# Patient Record
Sex: Female | Born: 1980 | Race: White | Hispanic: No | Marital: Married | Smoking: Never smoker
Health system: Southern US, Community
[De-identification: ages and names within clinical notes are randomized; demographics above are authoritative.]

## PROBLEM LIST (undated history)

## (undated) DIAGNOSIS — K900 Celiac disease: Secondary | ICD-10-CM

## (undated) DIAGNOSIS — Z789 Other specified health status: Secondary | ICD-10-CM

## (undated) DIAGNOSIS — E079 Disorder of thyroid, unspecified: Secondary | ICD-10-CM

## (undated) HISTORY — DX: Disorder of thyroid, unspecified: E07.9

## (undated) HISTORY — PX: LAPAROSCOPY: SHX197

---

## 2006-06-22 ENCOUNTER — Ambulatory Visit: Payer: Self-pay | Admitting: Gastroenterology

## 2006-07-01 ENCOUNTER — Ambulatory Visit: Payer: Self-pay | Admitting: Gastroenterology

## 2006-07-31 ENCOUNTER — Ambulatory Visit: Payer: Self-pay | Admitting: Gastroenterology

## 2007-09-20 ENCOUNTER — Inpatient Hospital Stay: Payer: Self-pay | Admitting: Gastroenterology

## 2007-09-20 ENCOUNTER — Ambulatory Visit: Payer: Self-pay | Admitting: Gastroenterology

## 2007-10-27 ENCOUNTER — Ambulatory Visit: Payer: Self-pay | Admitting: Internal Medicine

## 2009-04-10 ENCOUNTER — Ambulatory Visit: Payer: Self-pay | Admitting: Gastroenterology

## 2009-07-20 ENCOUNTER — Ambulatory Visit: Payer: Self-pay | Admitting: Gastroenterology

## 2009-07-31 ENCOUNTER — Ambulatory Visit: Payer: Self-pay | Admitting: Gastroenterology

## 2009-08-07 ENCOUNTER — Ambulatory Visit: Payer: Self-pay | Admitting: Gastroenterology

## 2009-08-24 ENCOUNTER — Ambulatory Visit (HOSPITAL_COMMUNITY): Admission: RE | Admit: 2009-08-24 | Discharge: 2009-08-24 | Payer: Self-pay | Admitting: Obstetrics

## 2009-09-04 ENCOUNTER — Ambulatory Visit: Payer: Self-pay | Admitting: Gastroenterology

## 2009-10-05 ENCOUNTER — Ambulatory Visit: Payer: Self-pay | Admitting: Gastroenterology

## 2010-06-26 ENCOUNTER — Ambulatory Visit: Payer: Self-pay | Admitting: Obstetrics

## 2010-09-08 ENCOUNTER — Emergency Department: Payer: Self-pay | Admitting: Emergency Medicine

## 2010-09-26 LAB — CBC
MCV: 91.7 fL (ref 78.0–100.0)
Platelets: 264 10*3/uL (ref 150–400)
RBC: 4.32 MIL/uL (ref 3.87–5.11)
WBC: 7.3 10*3/uL (ref 4.0–10.5)

## 2010-09-26 LAB — ABO/RH: ABO/RH(D): O POS

## 2010-09-26 LAB — BASIC METABOLIC PANEL
BUN: 7 mg/dL (ref 6–23)
Calcium: 9.3 mg/dL (ref 8.4–10.5)
Chloride: 103 mEq/L (ref 96–112)
Creatinine, Ser: 0.71 mg/dL (ref 0.4–1.2)
GFR calc Af Amer: >60 mL/min (ref >60)
GFR calc non Af Amer: >60 mL/min (ref >60)

## 2010-09-26 LAB — URINALYSIS, ROUTINE W REFLEX MICROSCOPIC
Bilirubin Urine: NEGATIVE
Ketones, ur: >80 mg/dL — AB
Specific Gravity, Urine: 1.01 (ref 1.005–1.030)
Urobilinogen, UA: 0.2 mg/dL (ref 0.0–1.0)
pH: 6.5 (ref 5.0–8.0)

## 2010-09-26 LAB — TYPE AND SCREEN
ABO/RH(D): O POS
Antibody Screen: NEGATIVE

## 2010-09-26 LAB — URINE MICROSCOPIC-ADD ON

## 2010-09-26 LAB — PREGNANCY, URINE: Preg Test, Ur: NEGATIVE

## 2013-07-07 NOTE — L&D Delivery Note (Signed)
Operative Delivery Note At 6:56 AM a viable and healthy female was delivered via Vaginal, Vacuum Neurosurgeon).  Presentation: vertex; Position: Occiput,, Anterior; Station: +3.  Verbal consent: obtained from patient.  Risks and benefits discussed in detail.  Risks include, but are not limited to the risks of anesthesia, bleeding, infection, damage to maternal tissues, fetal cephalhematoma.  There is also the risk of inability to effect vaginal delivery of the head, or shoulder dystocia that cannot be resolved by established maneuvers, leading to the need for emergency cesarean section. Vacuum used due to deep variable deceleration and to decrease risk of impending fourth degree laceration APGAR: 9, 9; weight 7 lb 10.6 oz (3475 g).   Placenta status: Intact, Spontaneous.   Cord: 3 vessels with the following complications: None.  Cord YJ:WLKH  Anesthesia: Epidural  Instruments: mushroom vacuum Episiotomy: None Lacerations: 3rd degree;Perineal Suture Repair: 3.0 chromic vicryl 0-Vicryl for sphincter Est. Blood Loss (mL): 350  Mom to postpartum.  Baby to Couplet care / Skin to Skin.  Hajira Verhagen A 05/11/2014, 1:47 AM

## 2013-10-04 LAB — OB RESULTS CONSOLE RPR: RPR: NONREACTIVE

## 2013-10-04 LAB — OB RESULTS CONSOLE ANTIBODY SCREEN: ANTIBODY SCREEN: NEGATIVE

## 2013-10-04 LAB — OB RESULTS CONSOLE RUBELLA ANTIBODY, IGM: RUBELLA: NON-IMMUNE/NOT IMMUNE

## 2013-10-04 LAB — OB RESULTS CONSOLE ABO/RH: RH TYPE: POSITIVE

## 2013-10-04 LAB — OB RESULTS CONSOLE HEPATITIS B SURFACE ANTIGEN: Hepatitis B Surface Ag: NEGATIVE

## 2013-10-04 LAB — OB RESULTS CONSOLE HIV ANTIBODY (ROUTINE TESTING): HIV: NONREACTIVE

## 2013-10-21 LAB — OB RESULTS CONSOLE GC/CHLAMYDIA
Chlamydia: NEGATIVE
Gonorrhea: NEGATIVE

## 2014-03-07 ENCOUNTER — Inpatient Hospital Stay (HOSPITAL_COMMUNITY): Admission: AD | Admit: 2014-03-07 | Payer: Self-pay | Source: Ambulatory Visit | Admitting: Obstetrics

## 2014-04-18 LAB — OB RESULTS CONSOLE GBS: STREP GROUP B AG: NEGATIVE

## 2014-05-08 ENCOUNTER — Inpatient Hospital Stay (HOSPITAL_COMMUNITY)
Admission: AD | Admit: 2014-05-08 | Discharge: 2014-05-11 | DRG: 988 | Disposition: A | Discharge status: Home / Self Care | Payer: BC Managed Care – PPO | Source: Ambulatory Visit | Attending: Obstetrics and Gynecology | Admitting: Obstetrics and Gynecology

## 2014-05-08 ENCOUNTER — Encounter (HOSPITAL_COMMUNITY): Payer: Self-pay | Admitting: *Deleted

## 2014-05-08 DIAGNOSIS — O09523 Supervision of elderly multigravida, third trimester: Secondary | ICD-10-CM

## 2014-05-08 DIAGNOSIS — O9902 Anemia complicating childbirth: Secondary | ICD-10-CM | POA: Diagnosis present

## 2014-05-08 DIAGNOSIS — IMO0001 Reserved for inherently not codable concepts without codable children: Secondary | ICD-10-CM

## 2014-05-08 DIAGNOSIS — Z3A39 39 weeks gestation of pregnancy: Secondary | ICD-10-CM | POA: Diagnosis present

## 2014-05-08 DIAGNOSIS — D62 Acute posthemorrhagic anemia: Secondary | ICD-10-CM | POA: Diagnosis not present

## 2014-05-08 HISTORY — DX: Celiac disease: K90.0

## 2014-05-09 ENCOUNTER — Inpatient Hospital Stay (HOSPITAL_COMMUNITY): Payer: BC Managed Care – PPO | Admitting: Anesthesiology

## 2014-05-09 ENCOUNTER — Encounter (HOSPITAL_COMMUNITY): Payer: Self-pay | Admitting: *Deleted

## 2014-05-09 DIAGNOSIS — Z3A39 39 weeks gestation of pregnancy: Secondary | ICD-10-CM | POA: Diagnosis present

## 2014-05-09 DIAGNOSIS — Z3403 Encounter for supervision of normal first pregnancy, third trimester: Secondary | ICD-10-CM | POA: Diagnosis present

## 2014-05-09 DIAGNOSIS — O09523 Supervision of elderly multigravida, third trimester: Secondary | ICD-10-CM | POA: Diagnosis not present

## 2014-05-09 DIAGNOSIS — D62 Acute posthemorrhagic anemia: Secondary | ICD-10-CM | POA: Diagnosis not present

## 2014-05-09 DIAGNOSIS — O9902 Anemia complicating childbirth: Secondary | ICD-10-CM | POA: Diagnosis present

## 2014-05-09 DIAGNOSIS — IMO0001 Reserved for inherently not codable concepts without codable children: Secondary | ICD-10-CM

## 2014-05-09 LAB — CBC
HCT: 37.9 % (ref 36.0–46.0)
Hemoglobin: 13.3 g/dL (ref 12.0–15.0)
MCH: 30.9 pg (ref 26.0–34.0)
MCHC: 35.1 g/dL (ref 30.0–36.0)
MCV: 87.9 fL (ref 78.0–100.0)
PLATELETS: 245 10*3/uL (ref 150–400)
RBC: 4.31 MIL/uL (ref 3.87–5.11)
RDW: 14.3 % (ref 11.5–15.5)
WBC: 14.8 10*3/uL — AB (ref 4.0–10.5)

## 2014-05-09 LAB — TYPE AND SCREEN
ABO/RH(D): O POS
ANTIBODY SCREEN: NEGATIVE

## 2014-05-09 LAB — RPR

## 2014-05-09 MED ORDER — LANOLIN HYDROUS EX OINT: TOPICAL_OINTMENT | CUTANEOUS | Status: DC | PRN

## 2014-05-09 MED ORDER — FERROUS SULFATE 325 (65 FE) MG PO TABS: 325.0000 mg | ORAL_TABLET | Freq: Two times a day (BID) | ORAL | Status: DC

## 2014-05-09 MED ORDER — PHENYLEPHRINE 40 MCG/ML (10ML) SYRINGE FOR IV PUSH (FOR BLOOD PRESSURE SUPPORT)
80.0000 ug | PREFILLED_SYRINGE | INTRAVENOUS | Status: DC | PRN
  Filled 2014-05-09: qty 2

## 2014-05-09 MED ORDER — TERBUTALINE SULFATE 1 MG/ML IJ SOLN: 0.2500 mg | Freq: Once | INTRAMUSCULAR | Status: DC | PRN

## 2014-05-09 MED ORDER — DIPHENHYDRAMINE HCL 50 MG/ML IJ SOLN: 12.5000 mg | INTRAMUSCULAR | Status: DC | PRN

## 2014-05-09 MED ORDER — OXYTOCIN 40 UNITS IN LACTATED RINGERS INFUSION - SIMPLE MED
1.0000 m[IU]/min | INTRAVENOUS | Status: DC
  Administered 2014-05-09: 2 m[IU]/min via INTRAVENOUS

## 2014-05-09 MED ORDER — LACTATED RINGERS IV SOLN
500.0000 mL | Freq: Once | INTRAVENOUS | Status: AC
  Administered 2014-05-09: 500 mL via INTRAVENOUS

## 2014-05-09 MED ORDER — FENTANYL 2.5 MCG/ML BUPIVACAINE 1/10 % EPIDURAL INFUSION (WH - ANES)
INTRAMUSCULAR | Status: DC | PRN
  Administered 2014-05-09: 14 mL/h via EPIDURAL

## 2014-05-09 MED ORDER — ONDANSETRON HCL 4 MG/2ML IJ SOLN: 4.0000 mg | Freq: Four times a day (QID) | INTRAMUSCULAR | Status: DC | PRN

## 2014-05-09 MED ORDER — TERBUTALINE SULFATE 1 MG/ML IJ SOLN
0.2500 mg | Freq: Once | INTRAMUSCULAR | Status: DC | PRN
Start: 2014-05-09 — End: 2014-05-09

## 2014-05-09 MED ORDER — OXYTOCIN 40 UNITS IN LACTATED RINGERS INFUSION - SIMPLE MED
62.5000 mL/h | INTRAVENOUS | Status: DC
  Filled 2014-05-09: qty 1000

## 2014-05-09 MED ORDER — ONDANSETRON HCL 4 MG/2ML IJ SOLN: 4.0000 mg | INTRAMUSCULAR | Status: DC | PRN

## 2014-05-09 MED ORDER — CITRIC ACID-SODIUM CITRATE 334-500 MG/5ML PO SOLN: 30.0000 mL | ORAL | Status: DC | PRN

## 2014-05-09 MED ORDER — SIMETHICONE 80 MG PO CHEW: 80.0000 mg | CHEWABLE_TABLET | ORAL | Status: DC | PRN

## 2014-05-09 MED ORDER — PRENATAL MULTIVITAMIN CH
1.0000 | ORAL_TABLET | Freq: Every day | ORAL | Status: DC
  Filled 2014-05-09 (×2): qty 1

## 2014-05-09 MED ORDER — SENNOSIDES-DOCUSATE SODIUM 8.6-50 MG PO TABS
2.0000 | ORAL_TABLET | ORAL | Status: DC
  Administered 2014-05-10: 2 via ORAL
  Filled 2014-05-09: qty 2

## 2014-05-09 MED ORDER — LACTATED RINGERS IV SOLN: 500.0000 mL | INTRAVENOUS | Status: DC | PRN

## 2014-05-09 MED ORDER — INFLUENZA VAC SPLIT QUAD 0.5 ML IM SUSY: 0.5000 mL | PREFILLED_SYRINGE | INTRAMUSCULAR | Status: DC | PRN

## 2014-05-09 MED ORDER — DIPHENHYDRAMINE HCL 25 MG PO CAPS: 25.0000 mg | ORAL_CAPSULE | Freq: Four times a day (QID) | ORAL | Status: DC | PRN

## 2014-05-09 MED ORDER — FENTANYL 2.5 MCG/ML BUPIVACAINE 1/10 % EPIDURAL INFUSION (WH - ANES)
14.0000 mL/h | INTRAMUSCULAR | Status: DC | PRN
  Administered 2014-05-09: 14 mL/h via EPIDURAL
  Filled 2014-05-09: qty 125

## 2014-05-09 MED ORDER — LIDOCAINE HCL (PF) 1 % IJ SOLN
INTRAMUSCULAR | Status: DC | PRN
  Administered 2014-05-09 (×2): 8 mL

## 2014-05-09 MED ORDER — EPHEDRINE 5 MG/ML INJ
10.0000 mg | INTRAVENOUS | Status: DC | PRN
  Filled 2014-05-09: qty 2

## 2014-05-09 MED ORDER — DIBUCAINE 1 % RE OINT: 1.0000 "application " | TOPICAL_OINTMENT | RECTAL | Status: DC | PRN

## 2014-05-09 MED ORDER — OXYCODONE-ACETAMINOPHEN 5-325 MG PO TABS: 2.0000 | ORAL_TABLET | ORAL | Status: DC | PRN

## 2014-05-09 MED ORDER — BENZOCAINE-MENTHOL 20-0.5 % EX AERO
1.0000 "application " | INHALATION_SPRAY | CUTANEOUS | Status: DC | PRN
  Administered 2014-05-09: 1 via TOPICAL
  Filled 2014-05-09 (×2): qty 56

## 2014-05-09 MED ORDER — WITCH HAZEL-GLYCERIN EX PADS: 1.0000 "application " | MEDICATED_PAD | CUTANEOUS | Status: DC | PRN

## 2014-05-09 MED ORDER — LIDOCAINE HCL (PF) 1 % IJ SOLN
30.0000 mL | INTRAMUSCULAR | Status: DC | PRN
  Filled 2014-05-09: qty 30

## 2014-05-09 MED ORDER — LACTATED RINGERS IV SOLN
INTRAVENOUS | Status: DC
  Administered 2014-05-09 (×2): via INTRAVENOUS

## 2014-05-09 MED ORDER — OXYCODONE-ACETAMINOPHEN 5-325 MG PO TABS
1.0000 | ORAL_TABLET | ORAL | Status: DC | PRN
  Administered 2014-05-09 – 2014-05-11 (×5): 1 via ORAL
  Filled 2014-05-09 (×4): qty 1

## 2014-05-09 MED ORDER — OXYCODONE-ACETAMINOPHEN 5-325 MG PO TABS
2.0000 | ORAL_TABLET | ORAL | Status: DC | PRN
  Administered 2014-05-10: 2 via ORAL
  Filled 2014-05-09: qty 2

## 2014-05-09 MED ORDER — OXYCODONE-ACETAMINOPHEN 5-325 MG PO TABS
1.0000 | ORAL_TABLET | ORAL | Status: DC | PRN
  Filled 2014-05-09: qty 1

## 2014-05-09 MED ORDER — FLEET ENEMA 7-19 GM/118ML RE ENEM: 1.0000 | ENEMA | RECTAL | Status: DC | PRN

## 2014-05-09 MED ORDER — ZOLPIDEM TARTRATE 5 MG PO TABS: 5.0000 mg | ORAL_TABLET | Freq: Every evening | ORAL | Status: DC | PRN

## 2014-05-09 MED ORDER — IBUPROFEN 600 MG PO TABS
600.0000 mg | ORAL_TABLET | Freq: Four times a day (QID) | ORAL | Status: DC
  Administered 2014-05-09 – 2014-05-11 (×10): 600 mg via ORAL
  Filled 2014-05-09 (×12): qty 1

## 2014-05-09 MED ORDER — MISOPROSTOL 200 MCG PO TABS
ORAL_TABLET | ORAL | Status: AC
  Filled 2014-05-09: qty 4

## 2014-05-09 MED ORDER — OXYTOCIN BOLUS FROM INFUSION: 500.0000 mL | INTRAVENOUS | Status: DC

## 2014-05-09 MED ORDER — ONDANSETRON HCL 4 MG PO TABS: 4.0000 mg | ORAL_TABLET | ORAL | Status: DC | PRN

## 2014-05-09 MED ORDER — ACETAMINOPHEN 325 MG PO TABS: 650.0000 mg | ORAL_TABLET | ORAL | Status: DC | PRN

## 2014-05-09 MED ORDER — PHENYLEPHRINE 40 MCG/ML (10ML) SYRINGE FOR IV PUSH (FOR BLOOD PRESSURE SUPPORT)
80.0000 ug | PREFILLED_SYRINGE | INTRAVENOUS | Status: DC | PRN
  Filled 2014-05-09: qty 10
  Filled 2014-05-09: qty 2

## 2014-05-09 NOTE — Plan of Care (Signed)
Problem: Phase I Progression Outcomes Goal: Voiding adequately Outcome: Completed/Met Date Met:  05/09/14

## 2014-05-09 NOTE — Anesthesia Preprocedure Evaluation (Signed)

## 2014-05-09 NOTE — Lactation Note (Signed)
This note was copied from the chart of [facility]. Lactation Consultation Note Initial visit at 35 hours of age.  Mom reports baby is feeding for the 3rd time.  She reports this being the first time and a little soreness on this left side.  Mom is holding baby in a well supported football hold, with wide flanged lips and rhythmic sucking.  Baby stops after about 20 minutes and then mom needs minimal assist to latch baby back to breast.  Nipple only slightly compressed, but mostly round when baby unlatched.   Mom praised for holding baby deeply at breast with nose chin and cheeks touching breast.  Baby total feeding of 35 minutes.  [facility] resources given and discussed.  Encouraged to feed with early cues on demand.  Early newborn behavior discussed.  Hand expression demonstrated with colostrum visible.  Mom to call for assist as needed.    Patient Name: Brittney [name] Today's Date: 05/09/2014 Reason for consult: Initial assessment   Maternal Data Has patient been taught Hand Expression?: Yes Does the patient have breastfeeding experience prior to this delivery?: No  Feeding Feeding Type: Breast Fed Length of feed: 35 min (good feeding)  LATCH Score/Interventions Latch: Grasps breast easily, tongue down, lips flanged, rhythmical sucking.  Audible Swallowing: A few with stimulation Intervention(s): Skin to skin;Hand expression;Alternate breast massage  Type of Nipple: Everted at rest and after stimulation  Comfort (Breast/Nipple): Soft / non-tender     Hold (Positioning): Assistance needed to correctly position infant at breast and maintain latch. Intervention(s): Skin to skin;Position options;Support Pillows;Breastfeeding basics reviewed  LATCH Score: 8  Lactation Tools Discussed/Used     Consult Status Consult Status: Follow-up Date: 05/10/14 Follow-up type: In-patient    Kendell Bane Justine Null 05/09/2014, 5:17 PM

## 2014-05-09 NOTE — Anesthesia Procedure Notes (Signed)
Epidural Patient location during procedure: OB Start time: 05/09/2014 3:08 AM End time: 05/09/2014 3:12 AM  Staffing Anesthesiologist: Lyn Hollingshead  Preanesthetic Checklist Completed: patient identified, surgical consent, pre-op evaluation, timeout performed, IV checked, risks and benefits discussed and monitors and equipment checked  Epidural Patient position: sitting Prep: site prepped and draped and DuraPrep Patient monitoring: continuous pulse ox and blood pressure Approach: midline Location: L3-L4 Injection technique: LOR air  Needle:  Needle type: Tuohy  Needle gauge: 17 G Needle length: 9 cm and 9 Needle insertion depth: 6 cm Catheter type: closed end flexible Catheter size: 19 Gauge Catheter at skin depth: 11 cm Test dose: negative and Other  Assessment Sensory level: T9 Events: blood not aspirated, injection not painful, no injection resistance, negative IV test and paresthesia  Additional Notes L legReason for block:procedure for pain

## 2014-05-09 NOTE — Plan of Care (Signed)
Problem: Phase I Progression Outcomes Goal: Initial discharge plan identified Outcome: Completed/Met Date Met:  05/09/14     

## 2014-05-09 NOTE — H&P (Signed)
Brittney Brittney [name] is Brittney [name] 33 y.o. female presenting @ term in active labor` Maternal Medical History:  Reason for admission: Contractions.   Fetal activity: Perceived fetal activity is normal.    Prenatal complications: no prenatal complications   OB History    Gravida Para Term Preterm AB TAB SAB Ectopic Multiple Living   1              Past Medical History  Diagnosis Date  . Celiac disease    Past Surgical History  Procedure Laterality Date  . Laparoscopy     Family History: family history is not on file. Social History:  reports that she has never smoked. She has never used smokeless tobacco. She reports that she does not drink alcohol or use illicit drugs.   Prenatal Transfer Tool  Maternal Diabetes: No Genetic Screening: Declined Maternal Ultrasounds/Referrals: Normal Fetal Ultrasounds or other Referrals:  None Maternal Substance Abuse:  No Significant Maternal Medications:  None Significant Maternal Lab Results:  Lab values include: Group B Strep negative Other Comments:  None  Review of Systems  All other systems reviewed and are negative.   Dilation: 10 Effacement (%): 100 Station: -1 Exam by:: Humberto Leep RN  Blood pressure 105/65, pulse 106, temperature 97.5 F (36.4 C), temperature source Oral, resp. rate 18, height 5\' 7"  (1.702 m), weight 80.922 kg (178 lb 6.4 oz). Exam Physical Exam  Constitutional: She is oriented to person, place, and time. She appears well-developed and well-nourished.  HENT:  Head: Atraumatic.  Eyes: EOM are normal.  Neck: Neck supple.  Cardiovascular: Regular rhythm.   Respiratory: Breath sounds normal.  GI: Soft.  Musculoskeletal: She exhibits no edema.  Neurological: She is alert and oriented to person, place, and time.  Skin: Skin is warm and dry.  Psychiatric: She has Brittney [name] normal mood and affect.    Prenatal labs: ABO, Rh: O/Positive/-- (03/31 0000) Antibody: Negative (03/31 0000) Rubella: Nonimmune (03/31 0000) RPR:  Nonreactive (03/31 0000)  HBsAg: Negative (03/31 0000)  HIV: Non-reactive (03/31 0000)  GBS: Negative (10/13 0000)   Assessment/Plan: Active phase Term gestation P0 admit routine labs. Epidural . Amniotomy prn. Rubella vaccine pp   Brittney Brittney [name] 05/09/2014, 4:00 AM

## 2014-05-09 NOTE — Anesthesia Postprocedure Evaluation (Signed)
Anesthesia Post Note  Patient: Brittney [name]  Procedure(s) Performed: * No procedures listed *  Anesthesia type: Epidural  Patient location: Mother/Baby  Post pain: Pain level controlled  Post assessment: Post-op Vital signs reviewed  Last Vitals:  Filed Vitals:   05/09/14 1123  BP: 133/84  Pulse: 92  Temp: 37.4 C  Resp: 20    Post vital signs: Reviewed  Level of consciousness:alert  Complications: No apparent anesthesia complications

## 2014-05-09 NOTE — MAU Note (Signed)
Pt in room. Will place monitors.

## 2014-05-09 NOTE — Progress Notes (Signed)
S: breathing with ctx  O: E$pidural VE fully -2/-1 AROM clear fluid asynclytic LOP  Tracing: baseline 140 (+) accel Ctx q 2-4 mins( couplets)  IMP: arrest of descent term gestation P) right exaggerated sims position. Pitocin augmentation

## 2014-05-10 DIAGNOSIS — D62 Acute posthemorrhagic anemia: Secondary | ICD-10-CM | POA: Diagnosis not present

## 2014-05-10 LAB — CBC
HEMATOCRIT: 29.9 % — AB (ref 36.0–46.0)
HEMOGLOBIN: 10.3 g/dL — AB (ref 12.0–15.0)
MCH: 30.8 pg (ref 26.0–34.0)
MCHC: 34.4 g/dL (ref 30.0–36.0)
MCV: 89.5 fL (ref 78.0–100.0)
Platelets: 200 10*3/uL (ref 150–400)
RBC: 3.34 MIL/uL — ABNORMAL LOW (ref 3.87–5.11)
RDW: 14.8 % (ref 11.5–15.5)
WBC: 16.3 10*3/uL — AB (ref 4.0–10.5)

## 2014-05-10 MED ORDER — DOCUSATE SODIUM 100 MG PO CAPS
100.0000 mg | ORAL_CAPSULE | Freq: Three times a day (TID) | ORAL | Status: DC
  Administered 2014-05-10 – 2014-05-11 (×2): 100 mg via ORAL
  Filled 2014-05-10 (×2): qty 1

## 2014-05-10 MED ORDER — MEASLES, MUMPS & RUBELLA VAC ~~LOC~~ INJ
0.5000 mL | INJECTION | Freq: Once | SUBCUTANEOUS | Status: AC
  Administered 2014-05-11: 0.5 mL via SUBCUTANEOUS
  Filled 2014-05-10 (×2): qty 0.5

## 2014-05-10 NOTE — Progress Notes (Signed)
PPD #1- SVD  Subjective:   Reports feeling ok, frustrated with BF Tolerating po/ No nausea or vomiting Bleeding is light Pain controlled with Motrin and Percocet Up ad lib / ambulatory / voiding without problems Newborn: breastfeeding-trouble latching    Objective:   VS:  VS:  Filed Vitals:   05/09/14 1502 05/09/14 2040 05/10/14 0031 05/10/14 0557  BP: 125/74 130/71 111/75 130/80  Pulse: 84 84 102 88  Temp: 98.4 F (36.9 C) 97.9 F (36.6 C) 98.2 F (36.8 C) 98.2 F (36.8 C)  TempSrc:  Oral Oral Oral  Resp: 20 20 20    Height:      Weight:      SpO2:  99% 97% 99%    LABS:  Recent Labs  05/09/14 0200 05/10/14 0547  WBC 14.8* 16.3*  HGB 13.3 10.3*  PLT 245 200   Blood type: --/--/O POS (11/03 0200) Rubella: Nonimmune (03/31 0000)   I&O: Intake/Output      11/03 0701 - 11/04 0700 11/04 0701 - 11/05 0700   Blood     Total Output       Net              Physical Exam: Alert and oriented x3 Abdomen: soft, non-tender, non-distended  Fundus: firm, non-tender, U-2 Perineum: Well approximated, no significant erythema, edema, or drainage; healing well. Lochia: small Extremities: No edema, no calf pain or tenderness    Assessment:  PPD #1 G1P1001/ S/P:vacuum extraction, 3rd degree laceration Mild anemia  Doing well    Plan: Continue routine post partum orders Lactation for assessment and recommendations Anticipate D/C home tomorrow   Julianne Handler, N MSN, CNM 05/10/2014, 10:20 AM

## 2014-05-10 NOTE — Lactation Note (Signed)
This note was copied from the chart of [place]. Lactation Consultation Note  Mother weepy, sore and requested assistance with latching. Mother''s nipples pink with abrasions on tips. Latched on left without NS and baby breastfed for 15 min with mother pain score 6.  Nipple pinched after. Relatched on right with #20NS and mother had improved comfort.  Baby breastfed for 9 min.  Nipple round after feeding. Provided mother with comfort gels for soreness.  Mother needs reassurance.  She demonstrates good positioning. Baby has disorganized suck.  Reviewed suck training.  Suggest parents make an outpt appt before discharge.  Patient Name: Brittney [name] [id number] Date: 05/10/2014 Reason for consult: Follow-up assessment   Maternal Data    Feeding Feeding Type: Breast Fed Length of feed: 24 min (half the time used #20NS)  LATCH Score/Interventions Latch: Grasps breast easily, tongue down, lips flanged, rhythmical sucking. Intervention(s): Adjust position;Assist with latch;Breast massage  Audible Swallowing: A few with stimulation  Type of Nipple: Everted at rest and after stimulation  Comfort (Breast/Nipple): Engorged, cracked, bleeding, large blisters, severe discomfort Problem noted: Cracked, bleeding, blisters, bruises Intervention(s): Expressed breast milk to nipple     Hold (Positioning): Assistance needed to correctly position infant at breast and maintain latch.  LATCH Score: 6  Lactation Tools Discussed/Used     Consult Status Consult Status: Follow-up Date: 05/11/14 Follow-up type: In-patient    [name] [facility] 05/10/2014, 10:11 AM

## 2014-05-10 NOTE — Plan of Care (Signed)
Problem: Discharge Progression Outcomes Goal: Tolerating diet Outcome: Completed/Met Date Met:  05/10/14     

## 2014-05-10 NOTE — Progress Notes (Signed)
RN in room and percocet given for pain scale 3 at present per pt request.Pt had asked RN earlier about gluten in senokot. RN spoke with pharmacist -"[name]'  . Pharmacist stated that no clinical information available to say no gluten in senokot or to say there is. Pt stated she can take COLACE. Husband made statement that pt had had Celiac Disease before.

## 2014-05-11 MED ORDER — IBUPROFEN 600 MG PO TABS: 600.0000 mg | ORAL_TABLET | Freq: Four times a day (QID) | ORAL | Status: DC

## 2014-05-11 NOTE — Plan of Care (Signed)
Problem: Consults Goal: Postpartum Patient Education (See Patient Education module for education specifics.)  Outcome: Not Applicable Date Met:  74/94/49 Goal: Skin Care Protocol Initiated - if Braden Score 18 or less If consults are not indicated, leave blank or document N/A  Outcome: Completed/Met Date Met:  05/11/14 Goal: Nutrition Consult-if indicated Outcome: Not Applicable Date Met:  67/59/16  Problem: Discharge Progression Outcomes Goal: Afebrile, VS remain stable at discharge Outcome: Completed/Met Date Met:  05/11/14 Goal: Other Discharge Outcomes/Goals Outcome: Completed/Met Date Met:  05/11/14

## 2014-05-11 NOTE — Discharge Instructions (Signed)
Breast Pumping Tips °If you are breastfeeding, there may be times when you cannot feed your baby directly. Returning to work or going on a trip are common examples. Pumping allows you to store breast milk and feed it to your baby later.  °You may not get much milk when you first start to pump. Your breasts should start to make more after a few days. If you pump at the times you usually feed your baby, you may be able to keep making enough milk to feed your baby without also using formula. The more often you pump, the more milk you will produce.  °WHEN SHOULD I PUMP?  °· You can begin to pump soon after delivery. However, some experts recommend waiting about 4 weeks before giving your infant a bottle to make sure breastfeeding is going well.  °· If you plan to return to work, begin pumping a few weeks before. This will help you develop techniques that work best for you. It also lets you build up a supply of breast milk.   °· When you are with your infant, feed on demand and pump after each feeding.   °· When you are away from your infant for several hours, pump for about 15 minutes every 2-3 hours. Pump both breasts at the same time if you can.   °· If your infant has a formula feeding, make sure to pump around the same time.     °· If you drink any alcohol, wait 2 hours before pumping.   °HOW DO I PREPARE TO PUMP? °Your let-down reflex is the natural reaction to stimulation that makes your breast milk flow. It is easier to stimulate this reflex when you are relaxed. Find relaxation techniques that work for you. If you have difficulty with your let-down reflex, try these methods:  °· Smell one of your infant's blankets or an item of clothing.   °· Look at a picture or video of your infant.   °· Sit in a quiet, private space.   °· Massage the breast you plan to pump.   °· Place soothing warmth on the breast.   °· Play relaxing music.   °WHAT ARE SOME GENERAL BREAST PUMPING TIPS? °· Wash your hands before you pump. You  do not need to wash your nipples or breasts. °· There are three ways to pump. °¨ You can use your hand to massage and compress your breast. °¨ You can use a handheld manual pump. °¨ You can use an electric pump.   °· Make sure the suction cup (flange) on the breast pump is the right size. Place the flange directly over the nipple. If it is the wrong size or placed the wrong way, it may be painful and cause nipple damage.   °· If pumping is uncomfortable, apply a small amount of purified or modified lanolin to your nipple and areola. °· If you are using an electric pump, adjust the speed and suction power to be more comfortable. °· If pumping is painful or if you are not getting very much milk, you may need a different type of pump. A lactation consultant can help you determine what type of pump to use.   °· Keep a full water bottle near you at all times. Drinking lots of fluid helps you make more milk.  °· You can store your milk to use later. Pumped breast milk can be stored in a sealable, sterile container or plastic bag. Label all stored breast milk with the date you pumped it. °¨ Milk can stay out at room temperature for up to 8 hours. °¨   You can store your milk in the refrigerator for up to 8 days. °¨ You can store your milk in the freezer for 3 months. Thaw frozen milk using warm water. Do not put it in the microwave. °· Do not smoke. Smoking can lower your milk supply and harm your infant. If you need help quitting, ask your health care provider to recommend a program.   °WHEN SHOULD I CALL MY HEALTH CARE PROVIDER OR A LACTATION CONSULTANT? °· You are having trouble pumping. °· You are concerned that you are not making enough milk. °· You have nipple pain, soreness, or redness. °· You want to use birth control. Birth control pills may lower your milk supply. Talk to your health care provider about your options. °Document Released: 12/11/2009 Document Revised: 06/28/2013 Document Reviewed:  04/15/2013 °Exit[employer]® Patient Information ©2015 [employer]. This information is not intended to replace advice given to you by your health care provider. Make sure you discuss any questions you have with your health care provider. ° °Nutrition for the New Mother  °A new mother needs good health and nutrition so she can have energy to take care of a new baby. Whether a mother breastfeeds or formula feeds the baby, it is important to have a well-balanced diet. Foods from all the food groups should be chosen to meet the new mother's energy needs and to give her the nutrients needed for repair and healing.  °A HEALTHY EATING PLAN °The My Pyramid plan for Moms outlines what you should eat to help you and your baby stay healthy. The energy and amount of food you need depends on whether or not you are breastfeeding. If you are breastfeeding you will need more nutrients. If you choose not to breastfeed, your nutrition goal should be to return to a healthy weight. Limiting calories may be needed if you are not breastfeeding.  °HOME CARE INSTRUCTIONS  °· For a personal plan based on your unique needs, see your Registered Dietitian or visit www.mypyramid.gov. °· Eat a variety of foods. The plan below will help guide you. The following chart has a suggested daily meal plan from the My Pyram[employer] for Moms. °· Eat a variety of fruits and vegetables. °· Eat more dark green and orange vegetables and cooked dried beans. °· Make half your grains whole grains. Choose whole instead of refined grains. °· Choose low-fat or lean meats and poultry. °· Choose low-fat or fat-free dairy products like milk, cheese, or yogurt. °Fruits °· Breastfeeding: 2 cups °· Non-Breastfeeding: 2 cups °· What Counts as a serving? °¨ 1 cup of fruit or juice. °¨ ½ cup dried fruit. °Vegetables °· Breastfeeding: 3 cups °· Non-Breastfeeding: 2 ½ cups °· What Counts as a serving? °¨ 1 cup raw or cooked vegetables. °¨ Juice or 2 cups raw leafy  vegetables. °Grains °· Breastfeeding: 8 oz °· Non-Breastfeeding: 6 oz °· What Counts as a serving? °¨ 1 slice bread. °¨ 1 oz ready-to-eat cereal. °¨ ½ cup cooked pasta, rice, or cereal. °Meat and Beans °· Breastfeeding: 6 ½ oz °· Non-Breastfeeding: 5 ½ oz °· What Counts as a serving? °¨ 1 oz lean meat, poultry, or fish °¨ ¼ cup cooked dry beans °¨ ½ oz nuts or 1 egg °¨ 1 tbs peanut butter °Milk °· Breastfeeding: 3 cups °· Non-Breastfeeding: 3 cups °· What Counts as a serving? °¨ 1 cup milk. °¨ 8 oz yogurt. °¨ 1 ½ oz cheese. °¨ 2 oz processed cheese. °TIPS FOR THE BREASTFEEDING MOM °· Rapid weight   loss is not suggested when you are breastfeeding. By simply breastfeeding, you will be able to lose the weight gained during your pregnancy. Your caregiver can keep track of your weight and tell you if your weight loss is appropriate.  Be sure to drink fluids. You may notice that you are thirstier than usual. A suggestion is to drink a glass of water or other beverage whenever you breastfeed.  Avoid alcohol as it can be passed into your breast milk.  Limit caffeine drinks to no more than 2 to 3 cups per day.  You may need to keep taking your prenatal vitamin while you are breastfeeding. Talk with your caregiver about taking a vitamin or supplement. RETURING TO A HEALTHY WEIGHT  The My Pyramid Plan for Moms will help you return to a healthy weight. It will also provide the nutrients you need.  You may need to limit "empty" calories. These include:  High fat foods like fried foods, fatty meats, fast food, butter, and mayonnaise.  High sugar foods like sodas, jelly, candy, and sweets.  Be physically active. Include 30 minutes of exercise or more each day. Choose an activity you like such as walking, swimming, biking, or aerobics. Check with your caregiver before you start to exercise. Document Released: 09/30/2007 Document Revised: 09/15/2011 Document Reviewed: 09/30/2007 [facility] Patient Information  2015 [place], [place]. This information is not intended to replace advice given to you by your health care provider. Make sure you discuss any questions you have with your health care provider. Postpartum Depression and Baby Blues The postpartum period begins right after the birth of a baby. During this time, there is often a great amount of joy and excitement. It is also a time of many changes in the life of the parents. Regardless of how many times a mother gives birth, each child brings new challenges and dynamics to the family. It is not unusual to have feelings of excitement along with confusing shifts in moods, emotions, and thoughts. All mothers are at risk of developing postpartum depression or the "baby blues." These mood changes can occur right after giving birth, or they may occur many months after giving birth. The baby blues or postpartum depression can be mild or severe. Additionally, postpartum depression can go away rather quickly, or it can be a long-term condition.  CAUSES Raised hormone levels and the rapid drop in those levels are thought to be a main cause of postpartum depression and the baby blues. A number of hormones change during and after pregnancy. Estrogen and progesterone usually decrease right after the delivery of your baby. The levels of thyroid hormone and various cortisol steroids also rapidly drop. Other factors that play a role in these mood changes include major life events and genetics.  RISK FACTORS If you have any of the following risks for the baby blues or postpartum depression, know what symptoms to watch out for during the postpartum period. Risk factors that may increase the likelihood of getting the baby blues or postpartum depression include:  Having a personal or family history of depression.   Having depression while being pregnant.   Having premenstrual mood issues or mood issues related to oral contraceptives.  Having a lot of life stress.   Having  marital conflict.   Lacking a social support network.   Having a baby with special needs.   Having health problems, such as diabetes.  SIGNS AND SYMPTOMS Symptoms of baby blues include:  Brief changes in mood, such as going  from extreme happiness to sadness. °· Decreased concentration.   °· Difficulty sleeping.   °· Crying spells, tearfulness.   °· Irritability.   °· Anxiety.   °Symptoms of postpartum depression typically begin within the first month after giving birth. These symptoms include: °· Difficulty sleeping or excessive sleepiness.   °· Marked weight loss.   °· Agitation.   °· Feelings of worthlessness.   °· Lack of interest in activity or food.   °Postpartum psychosis is a very serious condition and can be dangerous. Fortunately, it is rare. Displaying any of the following symptoms is cause for immediate medical attention. Symptoms of postpartum psychosis include:  °· Hallucinations and delusions.   °· Bizarre or disorganized behavior.   °· Confusion or disorientation.   °DIAGNOSIS  °A diagnosis is made by an evaluation of your symptoms. There are no medical or lab tests that lead to a diagnosis, but there are various questionnaires that a health care provider may use to identify those with the baby blues, postpartum depression, or psychosis. Often, a screening tool called the Edinburgh Postnatal Depression Scale is used to diagnose depression in the postpartum period.  °TREATMENT °The baby blues usually goes away on its own in 1-2 weeks. Social support is often all that is needed. You will be encouraged to get adequate sleep and rest. Occasionally, you may be given medicines to help you sleep.  °Postpartum depression requires treatment because it can last several months or longer if it is not treated. Treatment may include individual or group therapy, medicine, or both to address any social, physiological, and psychological factors that may play a role in the depression. Regular exercise, a  healthy diet, rest, and social support may also be strongly recommended.  °Postpartum psychosis is more serious and needs treatment right away. Hospitalization is often needed. °HOME CARE INSTRUCTIONS °· Get as much rest as you can. Nap when the baby sleeps.   °· Exercise regularly. Some women find yoga and walking to be beneficial.   °· Eat a balanced and nourishing diet.   °· Do little things that you enjoy. Have a cup of tea, take a bubble bath, read your favorite magazine, or listen to your favorite music. °· Avoid alcohol.   °· Ask for help with household chores, cooking, grocery shopping, or running errands as needed. Do not try to do everything.   °· Talk to people close to you about how you are feeling. Get support from your partner, family members, friends, or other new moms. °· Try to stay positive in how you think. Think about the things you are grateful for.   °· Do not spend a lot of time alone.   °· Only take over-the-counter or prescription medicine as directed by your health care provider. °· Keep all your postpartum appointments.   °· Let your health care provider know if you have any concerns.   °SEEK MEDICAL CARE IF: °You are having a reaction to or problems with your medicine. °SEEK IMMEDIATE MEDICAL CARE IF: °· You have suicidal feelings.   °· You think you may harm the baby or someone else. °MAKE SURE YOU: °· Understand these instructions. °· Will watch your condition. °· Will get help right away if you are not doing well or get worse. °Document Released: 03/27/2004 Document Revised: 06/28/2013 Document Reviewed: 04/04/2013 °Exit[employer]® Patient Information ©2015 [employer]. This information is not intended to replace advice given to you by your health care provider. Make sure you discuss any questions you have with your health care provider. °Breastfeeding and Mastitis °Mastitis is inflammation of the breast tissue. It can occur in women who   are breastfeeding. This can make breastfeeding  painful. Mastitis will sometimes go away on its own. Your health care provider will help determine if treatment is needed. °CAUSES °Mastitis is often associated with a blocked milk (lactiferous) duct. This can happen when too much milk builds up in the breast. Causes of excess milk in the breast can include: °· Poor latch-on. If your baby is not latched onto the breast properly, she or he may not empty your breast completely while breastfeeding. °· Allowing too much time to pass between feedings. °· Wearing a bra or other clothing that is too tight. This puts extra pressure on the lactiferous ducts so milk does not flow through them as it should. °Mastitis can also be caused by a bacterial infection. Bacteria may enter the breast tissue through cuts or openings in the skin. In women who are breastfeeding, this may occur because of cracked or irritated skin. Cracks in the skin are often caused when your baby does not latch on properly to the breast. °SIGNS AND SYMPTOMS °· Swelling, redness, tenderness, and pain in an area of the breast. °· Swelling of the glands under the arm on the same side. °· Fever may or may not accompany mastitis. °If an infection is allowed to progress, a collection of pus (abscess) may develop. °DIAGNOSIS  °Your health care provider can usually diagnose mastitis based on your symptoms and a physical exam. Tests may be done to help confirm the diagnosis. These may include: °· Removal of pus from the breast by applying pressure to the area. This pus can be examined in the lab to determine which bacteria are present. If an abscess has developed, the fluid in the abscess can be removed with a needle. This can also be used to confirm the diagnosis and determine the bacteria present. In most cases, pus will not be present. °· Blood tests to determine if your body is fighting a bacterial infection. °· Mammogram or ultrasound tests to rule out other problems or diseases. °TREATMENT  °Mastitis that  occurs with breastfeeding will sometimes go away on its own. Your health care provider may choose to wait 24 hours after first seeing you to decide whether a prescription medicine is needed. If your symptoms are worse after 24 hours, your health care provider will likely prescribe an antibiotic medicine to treat the mastitis. He or she will determine which bacteria are most likely causing the infection and will then select an appropriate antibiotic medicine. This is sometimes changed based on the results of tests performed to identify the bacteria, or if there is no response to the antibiotic medicine selected. Antibiotic medicines are usually given by mouth. You may also be given medicine for pain. °HOME CARE INSTRUCTIONS °· Only take over-the-counter or prescription medicines for pain, fever, or discomfort as directed by your health care provider. °· If your health care provider prescribed an antibiotic medicine, take the medicine as directed. Make sure you finish it even if you start to feel better. °· Do not wear a tight or underwire bra. Wear a soft, supportive bra. °· Increase your fluid intake, especially if you have a fever. °· Continue to empty the breast. Your health care provider can tell you whether this milk is safe for your infant or needs to be thrown out. You may be told to stop nursing until your health care provider thinks it is safe for your baby. Use a breast pump if you are advised to stop nursing. °· Keep your nipples   clean and dry. °· Empty the first breast completely before going to the other breast. If your baby is not emptying your breasts completely for some reason, use a breast pump to empty your breasts. °· If you go back to work, pump your breasts while at work to stay in time with your nursing schedule. °· Avoid allowing your breasts to become overly filled with milk (engorged). °SEEK MEDICAL CARE IF: °· You have pus-like discharge from the breast. °· Your symptoms do not improve with  the treatment prescribed by your health care provider within 2 days. °SEEK IMMEDIATE MEDICAL CARE IF: °· Your pain and swelling are getting worse. °· You have pain that is not controlled with medicine. °· You have a red line extending from the breast toward your armpit. °· You have a fever or persistent symptoms for more than 2-3 days. °· You have a fever and your symptoms suddenly get worse. °MAKE SURE YOU:  °· Understand these instructions. °· Will watch your condition. °· Will get help right away if you are not doing well or get worse. °Document Released: 10/18/2004 Document Revised: 06/28/2013 Document Reviewed: 01/27/2013 °[employer]® Patient Information ©2015 [employer]. This information is not intended to replace advice given to you by your health care provider. Make sure you discuss any questions you have with your health care provider. °Breastfeeding °Deciding to breastfeed is one of the best choices you can make for you and your baby. A change in hormones during pregnancy causes your breast tissue to grow and increases the number and size of your milk ducts. These hormones also allow proteins, sugars, and fats from your blood supply to make breast milk in your milk-producing glands. Hormones prevent breast milk from being released before your baby is born as well as prompt milk flow after birth. Once breastfeeding has begun, thoughts of your baby, as well as his or her sucking or crying, can stimulate the release of milk from your milk-producing glands.  °BENEFITS OF BREASTFEEDING °For Your Baby °· Your first milk (colostrum) helps your baby's digestive system function better.   °· There are antibodies in your milk that help your baby fight off infections.   °· Your baby has a lower incidence of asthma, allergies, and sudden infant death syndrome.   °· The nutrients in breast milk are better for your baby than infant formulas and are designed uniquely for your baby's needs.   °· Breast milk improves your  baby's brain development.   °· Your baby is less likely to develop other conditions, such as childhood obesity, asthma, or type 2 diabetes mellitus.   °For You  °· Breastfeeding helps to create a very special bond between you and your baby.   °· Breastfeeding is convenient. Breast milk is always available at the correct temperature and costs nothing.   °· Breastfeeding helps to burn calories and helps you lose the weight gained during pregnancy.   °· Breastfeeding makes your uterus contract to its prepregnancy size faster and slows bleeding (lochia) after you give birth.   °· Breastfeeding helps to lower your risk of developing type 2 diabetes mellitus, osteoporosis, and breast or ovarian cancer later in life. °SIGNS THAT YOUR BABY IS HUNGRY °Early Signs of Hunger  °· Increased alertness or activity. °· Stretching. °· Movement of the head from side to side. °· Movement of the head and opening of the mouth when the corner of the mouth or cheek is stroked (rooting). °· Increased sucking sounds, smacking lips, cooing, sighing, or squeaking. °· Hand-to-mouth movements. °· Increased sucking of   fingers or hands. °Late Signs of Hunger °· Fussing. °· Intermittent crying. °Extreme Signs of Hunger °Signs of extreme hunger will require calming and consoling before your baby will be able to breastfeed successfully. Do not wait for the following signs of extreme hunger to occur before you initiate breastfeeding:   °· Restlessness. °· A loud, strong cry. °·  Screaming. °BREASTFEEDING BASICS °Breastfeeding Initiation °· Find a comfortable place to sit or lie down, with your neck and back well supported. °· Place a pillow or rolled up blanket under your baby to bring him or her to the level of your breast (if you are seated). Nursing pillows are specially designed to help support your arms and your baby while you breastfeed. °· Make sure that your baby's abdomen is facing your abdomen.   °· Gently massage your breast. With your  fingertips, massage from your chest wall toward your nipple in a circular motion. This encourages milk flow. You may need to continue this action during the feeding if your milk flows slowly. °· Support your breast with 4 fingers underneath and your thumb above your nipple. Make sure your fingers are well away from your nipple and your baby's mouth.   °· Stroke your baby's lips gently with your finger or nipple.   °· When your baby's mouth is open wide enough, quickly bring your baby to your breast, placing your entire nipple and as much of the colored area around your nipple (areola) as possible into your baby's mouth.   °¨ More areola should be visible above your baby's upper lip than below the lower lip.   °¨ Your baby's tongue should be between his or her lower gum and your breast.   °· Ensure that your baby's mouth is correctly positioned around your nipple (latched). Your baby's lips should create a seal on your breast and be turned out (everted). °· It is common for your baby to suck about 2-3 minutes in order to start the flow of breast milk. °Latching °Teaching your baby how to latch on to your breast properly is very important. An improper latch can cause nipple pain and decreased milk supply for you and poor weight gain in your baby. Also, if your baby is not latched onto your nipple properly, he or she may swallow some air during feeding. This can make your baby fussy. Burping your baby when you switch breasts during the feeding can help to get rid of the air. However, teaching your baby to latch on properly is still the best way to prevent fussiness from swallowing air while breastfeeding. °Signs that your baby has successfully latched on to your nipple:    °· Silent tugging or silent sucking, without causing you pain.   °· Swallowing heard between every 3-4 sucks.   °·  Muscle movement above and in front of his or her ears while sucking.   °Signs that your baby has not successfully latched on to  nipple:  °· Sucking sounds or smacking sounds from your baby while breastfeeding. °· Nipple pain. °If you think your baby has not latched on correctly, slip your finger into the corner of your baby's mouth to break the suction and place it between your baby's gums. Attempt breastfeeding initiation again. °Signs of Successful Breastfeeding °Signs from your baby:   °· A gradual decrease in the number of sucks or complete cessation of sucking.   °· Falling asleep.   °· Relaxation of his or her body.   °· Retention of a small amount of milk in his or her mouth.   °· Letting go   of your breast by himself or herself. °Signs from you: °· Breasts that have increased in firmness, weight, and size 1-3 hours after feeding.   °· Breasts that are softer immediately after breastfeeding. °· Increased milk volume, as well as a change in milk consistency and color by the fifth day of breastfeeding.   °· Nipples that are not sore, cracked, or bleeding. °Signs That Your Baby is Getting Enough Milk °· Wetting at least 3 diapers in a 24-hour period. The urine should be clear and pale yellow by age 5 days. °· At least 3 stools in a 24-hour period by age 5 days. The stool should be soft and yellow. °· At least 3 stools in a 24-hour period by age 7 days. The stool should be seedy and yellow. °· No loss of weight greater than 10% of birth weight during the first 3 days of age. °· Average weight gain of 4-7 ounces (113-198 g) per week after age 4 days. °· Consistent daily weight gain by age 5 days, without weight loss after the age of 2 weeks. °After a feeding, your baby may spit up a small amount. This is common. °BREASTFEEDING FREQUENCY AND DURATION °Frequent feeding will help you make more milk and can prevent sore nipples and breast engorgement. Breastfeed when you feel the need to reduce the fullness of your breasts or when your baby shows signs of hunger. This is called "breastfeeding on demand." Avoid introducing a pacifier to your  baby while you are working to establish breastfeeding (the first 4-6 weeks after your baby is born). After this time you may choose to use a pacifier. Research has shown that pacifier use during the first year of a baby's life decreases the risk of sudden infant death syndrome (SIDS). °Allow your baby to feed on each breast as long as he or she wants. Breastfeed until your baby is finished feeding. When your baby unlatches or falls asleep while feeding from the first breast, offer the second breast. Because newborns are often sleepy in the first few weeks of life, you may need to awaken your baby to get him or her to feed. °Breastfeeding times will vary from baby to baby. However, the following rules can serve as a guide to help you ensure that your baby is properly fed: °· Newborns (babies 4 weeks of age or younger) may breastfeed every 1-3 hours. °· Newborns should not go longer than 3 hours during the day or 5 hours during the night without breastfeeding. °· You should breastfeed your baby a minimum of 8 times in a 24-hour period until you begin to introduce solid foods to your baby at around 6 months of age. °BREAST MILK PUMPING °Pumping and storing breast milk allows you to ensure that your baby is exclusively fed your breast milk, even at times when you are unable to breastfeed. This is especially important if you are going back to work while you are still breastfeeding or when you are not able to be present during feedings. Your lactation consultant can give you guidelines on how long it is safe to store breast milk.  °A breast pump is a machine that allows you to pump milk from your breast into a sterile bottle. The pumped breast milk can then be stored in a refrigerator or freezer. Some breast pumps are operated by hand, while others use electricity. Ask your lactation consultant which type will work best for you. Breast pumps can be purchased, but some hospitals and breastfeeding support groups   lease  breast pumps on a monthly basis. A lactation consultant can teach you how to hand express breast milk, if you prefer not to use a pump.  CARING FOR YOUR BREASTS WHILE YOU BREASTFEED Nipples can become dry, cracked, and sore while breastfeeding. The following recommendations can help keep your breasts moisturized and healthy:  Avoid using soap on your nipples.   Wear a supportive bra. Although not required, special nursing bras and tank tops are designed to allow access to your breasts for breastfeeding without taking off your entire bra or top. Avoid wearing underwire-style bras or extremely tight bras.  Air dry your nipples for 3-6minutes after each feeding.   Use only cotton bra pads to absorb leaked breast milk. Leaking of breast milk between feedings is normal.   Use lanolin on your nipples after breastfeeding. Lanolin helps to maintain your skin's normal moisture barrier. If you use pure lanolin, you do not need to wash it off before feeding your baby again. Pure lanolin is not toxic to your baby. You may also hand express a few drops of breast milk and gently massage that milk into your nipples and allow the milk to air dry. In the first few weeks after giving birth, some women experience extremely full breasts (engorgement). Engorgement can make your breasts feel heavy, warm, and tender to the touch. Engorgement peaks within 3-5 days after you give birth. The following recommendations can help ease engorgement:  Completely empty your breasts while breastfeeding or pumping. You may want to start by applying warm, moist heat (in the shower or with warm water-soaked hand towels) just before feeding or pumping. This increases circulation and helps the milk flow. If your baby does not completely empty your breasts while breastfeeding, pump any extra milk after he or she is finished.  Wear a snug bra (nursing or regular) or tank top for 1-2 days to signal your body to slightly decrease milk  production.  Apply ice packs to your breasts, unless this is too uncomfortable for you.  Make sure that your baby is latched on and positioned properly while breastfeeding. If engorgement persists after 48 hours of following these recommendations, contact your health care provider or a Science writer. OVERALL HEALTH CARE RECOMMENDATIONS WHILE BREASTFEEDING  Eat healthy foods. Alternate between meals and snacks, eating 3 of each per day. Because what you eat affects your breast milk, some of the foods may make your baby more irritable than usual. Avoid eating these foods if you are sure that they are negatively affecting your baby.  Drink milk, fruit juice, and water to satisfy your thirst (about 10 glasses a day).   Rest often, relax, and continue to take your prenatal vitamins to prevent fatigue, stress, and anemia.  Continue breast self-awareness checks.  Avoid chewing and smoking tobacco.  Avoid alcohol and drug use. Some medicines that may be harmful to your baby can pass through breast milk. It is important to ask your health care provider before taking any medicine, including all over-the-counter and prescription medicine as well as vitamin and herbal supplements. It is possible to become pregnant while breastfeeding. If birth control is desired, ask your health care provider about options that will be safe for your baby. SEEK MEDICAL CARE IF:   You feel like you want to stop breastfeeding or have become frustrated with breastfeeding.  You have painful breasts or nipples.  Your nipples are cracked or bleeding.  Your breasts are red, tender, or warm.  You have  a swollen area on either breast.  You have a fever or chills.  You have nausea or vomiting.  You have drainage other than breast milk from your nipples.  Your breasts do not become full before feedings by the fifth day after you give birth.  You feel sad and depressed.  Your baby is too sleepy to eat  well.  Your baby is having trouble sleeping.   Your baby is wetting less than 3 diapers in a 24-hour period.  Your baby has less than 3 stools in a 24-hour period.  Your baby's skin or the white part of his or her eyes becomes yellow.   Your baby is not gaining weight by 60 days of age. SEEK IMMEDIATE MEDICAL CARE IF:   Your baby is overly tired (lethargic) and does not want to wake up and feed.  Your baby develops an unexplained fever. Document Released: 06/23/2005 Document Revised: 06/28/2013 Document Reviewed: 12/15/2012 [facility] Patient Information 2015 [place], [place]. This information is not intended to replace advice given to you by your health care provider. Make sure you discuss any questions you have with your health care provider. Sitz Bath A sitz bath is a warm water bath taken in the sitting position that covers only the hips and buttocks. It may be used for either healing or hygiene purposes. Sitz baths are also used to relieve pain, itching, or muscle spasms. The water may contain medicine. Moist heat will help you heal and relax.  HOME CARE INSTRUCTIONS  Take 3 to 4 sitz baths a day.  Fill the bathtub half full with warm water.  Sit in the water and open the drain a little.  Turn on the warm water to keep the tub half full. Keep the water running constantly.  Soak in the water for 15 to 20 minutes.  After the sitz bath, pat the affected area dry first. SEEK MEDICAL CARE IF:  You get worse instead of better. Stop the sitz baths if you get worse. MAKE SURE YOU:  Understand these instructions.  Will watch your condition.  Will get help right away if you are not doing well or get worse. Document Released: 03/15/2004 Document Revised: 03/17/2012 Document Reviewed: 09/20/2010 [facility] Patient Information 2015 [place], [place]. This information is not intended to replace advice given to you by your health care provider. Make sure you discuss any questions you have  with your health care provider.

## 2014-05-11 NOTE — Plan of Care (Signed)
Problem: Discharge Progression Outcomes Goal: Barriers To Progression Addressed/Resolved Outcome: Completed/Met Date Met:  05/11/14 Goal: Activity appropriate for discharge plan Outcome: Completed/Met Date Met:  81/27/51 Goal: Complications resolved/controlled Outcome: Completed/Met Date Met:  05/11/14 Goal: Pain controlled with appropriate interventions Outcome: Completed/Met Date Met:  05/11/14 Goal: Remove staples per MD order Outcome: Not Applicable Date Met:  70/01/74 Goal: Discharge plan in place and appropriate Outcome: Completed/Met Date Met:  05/11/14

## 2014-05-11 NOTE — Lactation Note (Signed)
This note was copied from the chart of [place]. Lactation Consultation Note  Patient Name: GIRL Aren Cherne [id number] Date: 05/11/2014 Reason for consult: Follow-up assessment  LC consults this am x 3 . 1st consult , baby asleep in crib, LC assessed breast tissue with moms permission due to soreness bilaterally, left nipple noted to have a red positional strip , and the right abrasion noted on the tip of the nipple. Right breast , mom able to express colostrum , and unable to express on the left . Due to soreness recommended to mom prior to latch, breast massage , hand express, pre-pump with hand pump and then apply nipple shield , ( #20 NS still the right size , LC assessed and applied ). Also reviewed set up of hand pump and cleaning  2nd consult - Mom called with feeding cues, Baby awake and rooting, mom applied #20 NS properly, and LC assisted with latch and depth achieved on the nipple shield . 3 swallows noted on the right side and baby fed for 10 mins , off short moment and re-latched with depth. Baby has a tendency to become non - nutritive due to latch of flow.  Baby released from the 1st breast , and LC assisted to latch on the right breast in football position, depth achieved and increased swallows noted compared to the left side ( about 7 ).Baby still noted to become non -nutritive when the the flow isn't there and starts falling asleep. After feeding had dad hold baby , and mom pumped both breast , #24 flange the right fit. Mom pumped for 20 mins , with small amount of EBM yield, ( note enough to spoon feed , just to swab baby's mouth .) At this visit discussed with parents to need to supplement due to the bilirubin increasing , 8 % weight loss, and the baby acting like it is still hungry and being fussy . Also lack of milk noted in the nipple shield. Mom expressed her feelings not to supplement due to wanting to breast feed. LC explained the reasons why. 3rd visit , called by dad  saying we decided the baby needs to be supplement, she is hungry. LC explained how she could be supplemented at the breast with a SNS . Parents accepted offer of setting up the SNS . Baby latched well with a NS , appetizer of progestmil 20 and added SNS on top of the Nipple shield . Baby fed for a consistent 20- mins, and took the 21 ml well. Released and seemed content resting on moms chest. Mom plans to eat lunch and take a long nap.  Lactation Plan : Mom has a DEBP Medela at home. LC discussed with mom to feed the baby at least every 3 hours with Nipple shield #20 and with an SNS on top with 20 ml of formula in it and increase according to the supplementing guidelines and with feeding cues. Once mom is able to pump off Breast milk to switch formula out in the SNS. Due to soreness, feed on the 1st breast with SNS and post pump both breast for 10 -15 mins , next feeding feed on the other breast with SNS , and then post pump 10 -15 mins , save milk. Sore nipple prevention and tx . ( comfort gels , pre pumping , EBM to nipples)  Mom and dad seemed excited baby finally seemed satisfied after feeding. Due to bilirubin being boarder line , will be re-checked , and  whether mom and baby will be discharged is still  Pending .   Prior to discharge - [place] O/P apt will need to be set up.     Maternal Data Has patient been taught Hand Expression?: Yes  Feeding   LATCH Score/Interventions Latch: Grasps breast easily, tongue down, lips flanged, rhythmical sucking. Intervention(s): Adjust position  Audible Swallowing: A few with stimulation Intervention(s): Skin to skin  Type of Nipple: Everted at rest and after stimulation  Comfort (Breast/Nipple): Filling, red/small blisters or bruises, mild/mod discomfort Problem noted: Cracked, bleeding, blisters, bruises Intervention(s): Hand pump;Reverse pressure;Expressed breast milk to nipple  Problem noted: Cracked, bleeding, blisters, bruises Interventions   (Cracked/bleeding/bruising/blister): Expressed breast milk to nipple;Hand pump Interventions (Mild/moderate discomfort): Comfort gels;Breast shields;Hand expression  Hold (Positioning): No assistance needed to correctly position infant at breast. Intervention(s): Breastfeeding basics reviewed  LATCH Score: 8  Lactation Tools Discussed/Used     Consult Status Consult Status: Follow-up Date: 05/11/14 Follow-up type: In-patient    Myer Haff 05/11/2014, 9:53 AM

## 2014-05-11 NOTE — Lactation Note (Signed)
This note was copied from the chart of [place]. Lactation Consultation Note  Parents set up starter SNS themselves and filled with 20 ml pregestimil 20 cal. Latched baby with with #20NS. Demonstrated how to adjust tubing.  Parents seemed confident with SNS. Baby latched for 15 min.  Sucks and swallows observed and then she fell asleep. Parents moved baby and she started cueing again.  Suggested mother relatch baby with NS and no SNS. Baby sucked for 10 more min. Mother unlatched baby and there was no breastmilk in NS. Suggest giving baby a little more since she was still cueing. Prefilled NS using curved tip syringe with 5 ml. Baby latched and seemed content. Provided parents with double SNS.  Reviewed engorgement care and volume guidelines. Discussed pumping strategies. Parents go to [employer] and state there is a Science writer with their practice. Will try to make a lactation appt with [facility] and if not they will call back here to make outpatient appt.   Patient Name: Brittney [name] [id number] Date: 05/11/2014 Reason for consult: Follow-up assessment   Maternal Data    Feeding Feeding Type: Formula Length of feed: 15 min  LATCH Score/Interventions Latch: Grasps breast easily, tongue down, lips flanged, rhythmical sucking.  Audible Swallowing: Spontaneous and intermittent  Type of Nipple: Everted at rest and after stimulation  Comfort (Breast/Nipple): Soft / non-tender     Hold (Positioning): Assistance needed to correctly position infant at breast and maintain latch.  LATCH Score: 9  Lactation Tools Discussed/Used Tools: Nipple Shields Nipple shield size: 20   Consult Status Consult Status: Complete Date: 05/12/14 Follow-up type: Out-patient    [name] [facility] 05/11/2014, 8:07 PM

## 2014-05-11 NOTE — Discharge Summary (Signed)
Obstetric Discharge Summary  Reason for Admission: Pt is a G1P1001 at 50w3din active labor  [facility] with Dr. FPamala Hurryas primary provider.  Medications on Admission: Prescriptions prior to admission  Medication Sig Dispense Refill Last Dose  . nystatin-triamcinolone (MYCOLOG II) cream Apply 1 application topically 2 (two) times daily as needed (for vaginal rash, externally).   Past Week at Unknown time  . Prenatal Vit-Fe Fumarate-FA (PRENATAL MULTIVITAMIN) TABS tablet Take 1 tablet by mouth daily at 12 noon.   05/08/2014 at Unknown time    Prenatal Labs: ABO, Rh:O POS (11/03 0200) Antibody: NEG (11/03 0200) Rubella: Nonimmune (03/31 0000)  / MMR booster - to be given prior to d/c RPR: NON REAC (11/03 0200)  HBsAg: Negative (03/31 0000)  HIV: Non-reactive (03/31 0000)  GTT : abnormal 1 hr GTT / normal 3 hr GTT GBS: Negative (10/13 0000)   Prenatal Procedures: ultrasound Intrapartum Course: Admitted in active labor / rapid progression to complete dilation / Vacuum-assisted vaginal delivery of viable femlae with 3rd degree repair by Dr. CGarwin Brothers/ no immediate postpartum complications noted Intrapartum Procedures: vacuum Postpartum Procedures: none Complications-Operative and Postpartum: 3rd degree perineal laceration  Labs: HEMOGLOBIN  Date Value Ref Range Status  05/10/2014 10.3* 12.0 - 15.0 g/dL Final    Comment:    REPEATED TO VERIFY DELTA CHECK NOTED    HCT  Date Value Ref Range Status  05/10/2014 29.9* 36.0 - 46.0 % Final   Lab Results  Component Value Date   PLT 200 05/10/2014    Newborn Data: Live born female 05/09/2014 Birth Weight: 7 lb 10.6 oz (3475 g) APGAR: 9, 9  Home with mother.   Discharge Information: Date: 05/11/2014 Discharge Diagnoses:  Pt is a [id number] 330w3d/P Vacuum-Assisted Vaginal Delivery on 05/09/2014  Condition: stable Activity: pelvic rest Diet: routine Medications:     Medication List    TAKE these medications        ibuprofen 600 MG tablet  Commonly known as:  ADVIL,MOTRIN  Take 1 tablet (600 mg total) by mouth every 6 (six) hours.     nystatin-triamcinolone cream  Commonly known as:  MYCOLOG II  Apply 1 application topically 2 (two) times daily as needed (for vaginal rash, externally).     prenatal multivitamin Tabs tablet  Take 1 tablet by mouth daily at 12 noon.       Instructions: [facility] has been given and reviewed Discharge to: home     Follow-up Information    [facility] Schedule an appointment as soon as possible for a visit in 6 weeks.   Specialty:  Obstetrics and Gynecology   Why:  postpartum visit   Contact information:   [address][id number] SpringsROHobsonM,Jerilynn MagesMSN, CNM 05/11/2014, 9:20 AM

## 2014-05-11 NOTE — Progress Notes (Signed)
Patient ID: Brittney [name], female   DOB: 11/10/1980, 33 y.o.   MRN: [id number] Post Partum Day #2            Information for the patient's newborn:  Gwynne, Kemnitz [[id number]]  female   Feeding: breast  Subjective: No HA, SOB, CP, F/C, breast symptoms. Pain well-controlled with ibuprofen. Normal vaginal bleeding, no clots.      Objective:  Temp:  [98 F (36.7 C)-98.1 F (36.7 C)] 98 F (36.7 C) (11/05 0543) Pulse Rate:  [88-100] 88 (11/05 0543) Resp:  [18] 18 (11/05 0543) BP: (107-119)/(65-75) 107/65 mmHg (11/05 0543)  No intake or output data in the 24 hours ending 05/11/14 0857     Recent Labs  05/09/14 0200 05/10/14 0547  WBC 14.8* 16.3*  HGB 13.3 10.3*  HCT 37.9 29.9*  PLT 245 200    Blood type: O POS (11/03 0200) Rubella: Nonimmune (03/31 0000)    Physical Exam:  General: alert, cooperative and no distress Uterine Fundus: firm Lochia: appropriate Perineum: 3rd degree repair healing well, edema no DVT Evaluation: No evidence of DVT seen on physical exam. Negative Homan's sign. No cords or calf tenderness. Calf/Ankle trace edema is present.   Assessment/Plan: PPD # 2 / 33 y.o., G1P1001 S/P: vacuum extraction with 3rd degree laceration   Principal Problem:    Vacuum extractor delivery, delivered (11/3)  Active Problems:   Active labor    Postpartum care following vaginal delivery (11/3)    Third degree laceration of perineum during delivery, postpartum    Acute blood loss anemia   Normal postpartum exam  Continue current postpartum care  D/C home   LOS: 3 days   Laury Deep, M, MSN, CNM 05/11/2014, 8:57 AM

## 2014-10-26 LAB — OB RESULTS CONSOLE HIV ANTIBODY (ROUTINE TESTING): HIV: NONREACTIVE

## 2014-10-26 LAB — OB RESULTS CONSOLE RPR: RPR: NONREACTIVE

## 2014-11-01 ENCOUNTER — Ambulatory Visit: Payer: Self-pay | Admitting: Podiatry

## 2014-11-02 LAB — OB RESULTS CONSOLE GC/CHLAMYDIA
Chlamydia: NEGATIVE
Gonorrhea: NEGATIVE

## 2014-12-11 LAB — OB RESULTS CONSOLE GBS: STREP GROUP B AG: NEGATIVE

## 2014-12-13 ENCOUNTER — Ambulatory Visit: Payer: Self-pay | Admitting: Podiatry

## 2014-12-18 ENCOUNTER — Ambulatory Visit (INDEPENDENT_AMBULATORY_CARE_PROVIDER_SITE_OTHER): Payer: BLUE CROSS/BLUE SHIELD | Admitting: Podiatry

## 2014-12-18 ENCOUNTER — Encounter: Payer: Self-pay | Admitting: Podiatry

## 2014-12-18 DIAGNOSIS — B351 Tinea unguium: Secondary | ICD-10-CM

## 2014-12-18 NOTE — Progress Notes (Signed)
She presents today questioning whether or not she should have her toenail removed right foot. She had the hallux nail removed permanently left foot which she is enjoying without pain.  Objective: Currently she is [redacted] weeks pregnant. Pulses are strongly palpable right foot. Nail dystrophy hallux right. There is no signs of bacterial infection.  Assessment: Nail dystrophy without bacterial infection.  Plan: I encouraged her to wait until we are forced to do something about this toenail or until after she has given birth and discontinued breast-feeding. I will follow-up with her in the fall.

## 2015-01-19 ENCOUNTER — Inpatient Hospital Stay (HOSPITAL_COMMUNITY): Admission: AD | Admit: 2015-01-19 | Payer: Self-pay | Source: Ambulatory Visit | Admitting: Obstetrics

## 2015-05-10 ENCOUNTER — Other Ambulatory Visit: Payer: Self-pay | Admitting: Obstetrics

## 2015-05-15 ENCOUNTER — Other Ambulatory Visit: Payer: Self-pay | Admitting: Obstetrics

## 2015-05-21 ENCOUNTER — Encounter (HOSPITAL_COMMUNITY): Payer: Self-pay | Admitting: *Deleted

## 2015-05-21 ENCOUNTER — Inpatient Hospital Stay (HOSPITAL_COMMUNITY): Payer: BLUE CROSS/BLUE SHIELD | Admitting: Anesthesiology

## 2015-05-21 ENCOUNTER — Encounter (HOSPITAL_COMMUNITY)
Admission: AD | Disposition: A | Discharge status: Home / Self Care | Payer: Self-pay | Source: Ambulatory Visit | Attending: Obstetrics

## 2015-05-21 ENCOUNTER — Inpatient Hospital Stay (HOSPITAL_COMMUNITY)
Admission: AD | Admit: 2015-05-21 | Discharge: 2015-05-24 | DRG: 765 | Disposition: A | Discharge status: Home / Self Care | Payer: BLUE CROSS/BLUE SHIELD | Source: Ambulatory Visit | Attending: Obstetrics | Admitting: Obstetrics

## 2015-05-21 DIAGNOSIS — K589 Irritable bowel syndrome without diarrhea: Secondary | ICD-10-CM | POA: Diagnosis present

## 2015-05-21 DIAGNOSIS — Z3A39 39 weeks gestation of pregnancy: Secondary | ICD-10-CM | POA: Diagnosis not present

## 2015-05-21 DIAGNOSIS — D509 Iron deficiency anemia, unspecified: Secondary | ICD-10-CM | POA: Diagnosis present

## 2015-05-21 DIAGNOSIS — K219 Gastro-esophageal reflux disease without esophagitis: Secondary | ICD-10-CM | POA: Diagnosis present

## 2015-05-21 DIAGNOSIS — O99019 Anemia complicating pregnancy, unspecified trimester: Secondary | ICD-10-CM

## 2015-05-21 DIAGNOSIS — O99824 Streptococcus B carrier state complicating childbirth: Secondary | ICD-10-CM | POA: Diagnosis present

## 2015-05-21 DIAGNOSIS — O09523 Supervision of elderly multigravida, third trimester: Secondary | ICD-10-CM | POA: Diagnosis not present

## 2015-05-21 DIAGNOSIS — O9962 Diseases of the digestive system complicating childbirth: Secondary | ICD-10-CM | POA: Diagnosis present

## 2015-05-21 DIAGNOSIS — D62 Acute posthemorrhagic anemia: Secondary | ICD-10-CM | POA: Diagnosis not present

## 2015-05-21 DIAGNOSIS — O26893 Other specified pregnancy related conditions, third trimester: Secondary | ICD-10-CM | POA: Diagnosis present

## 2015-05-21 DIAGNOSIS — O321XX0 Maternal care for breech presentation, not applicable or unspecified: Secondary | ICD-10-CM | POA: Diagnosis present

## 2015-05-21 DIAGNOSIS — O9081 Anemia of the puerperium: Secondary | ICD-10-CM | POA: Diagnosis not present

## 2015-05-21 HISTORY — DX: Other specified health status: Z78.9

## 2015-05-21 LAB — TYPE AND SCREEN
ABO/RH(D): O POS
Antibody Screen: NEGATIVE

## 2015-05-21 LAB — CBC
HCT: 35 % — ABNORMAL LOW (ref 36.0–46.0)
Hemoglobin: 11.8 g/dL — ABNORMAL LOW (ref 12.0–15.0)
MCH: 29 pg (ref 26.0–34.0)
MCHC: 33.7 g/dL (ref 30.0–36.0)
MCV: 86 fL (ref 78.0–100.0)
PLATELETS: 235 10*3/uL (ref 150–400)
RBC: 4.07 MIL/uL (ref 3.87–5.11)
RDW: 14.5 % (ref 11.5–15.5)
WBC: 8.1 10*3/uL (ref 4.0–10.5)

## 2015-05-21 SURGERY — Surgical Case
Anesthesia: Spinal

## 2015-05-21 MED ORDER — NALBUPHINE HCL 10 MG/ML IJ SOLN: 5.0000 mg | INTRAMUSCULAR | Status: DC | PRN

## 2015-05-21 MED ORDER — MENTHOL 3 MG MT LOZG
1.0000 | LOZENGE | OROMUCOSAL | Status: DC | PRN
  Filled 2015-05-21: qty 9

## 2015-05-21 MED ORDER — SENNOSIDES-DOCUSATE SODIUM 8.6-50 MG PO TABS
2.0000 | ORAL_TABLET | ORAL | Status: DC
  Administered 2015-05-22 – 2015-05-24 (×2): 2 via ORAL
  Filled 2015-05-21 (×3): qty 2

## 2015-05-21 MED ORDER — DEXTROSE 5 % IV SOLN: 1.0000 ug/kg/h | INTRAVENOUS | Status: DC | PRN

## 2015-05-21 MED ORDER — KETOROLAC TROMETHAMINE 30 MG/ML IJ SOLN
INTRAMUSCULAR | Status: AC
  Administered 2015-05-21: 30 mg
  Filled 2015-05-21: qty 1

## 2015-05-21 MED ORDER — OXYCODONE-ACETAMINOPHEN 5-325 MG PO TABS
1.0000 | ORAL_TABLET | ORAL | Status: DC | PRN
Start: 2015-05-21 — End: 2015-05-24
  Administered 2015-05-22 – 2015-05-24 (×7): 1 via ORAL
  Filled 2015-05-21 (×7): qty 1

## 2015-05-21 MED ORDER — SIMETHICONE 80 MG PO CHEW
80.0000 mg | CHEWABLE_TABLET | ORAL | Status: DC | PRN
  Filled 2015-05-21: qty 1

## 2015-05-21 MED ORDER — PHENYLEPHRINE HCL 10 MG/ML IJ SOLN
INTRAMUSCULAR | Status: DC | PRN
  Administered 2015-05-21: 80 ug via INTRAVENOUS

## 2015-05-21 MED ORDER — MEPERIDINE HCL 25 MG/ML IJ SOLN: 6.2500 mg | INTRAMUSCULAR | Status: DC | PRN

## 2015-05-21 MED ORDER — MORPHINE SULFATE (PF) 0.5 MG/ML IJ SOLN
INTRAMUSCULAR | Status: AC
  Filled 2015-05-21: qty 100

## 2015-05-21 MED ORDER — FENTANYL CITRATE (PF) 100 MCG/2ML IJ SOLN
INTRAMUSCULAR | Status: AC
  Filled 2015-05-21: qty 4

## 2015-05-21 MED ORDER — DIBUCAINE 1 % RE OINT: 1.0000 "application " | TOPICAL_OINTMENT | RECTAL | Status: DC | PRN

## 2015-05-21 MED ORDER — FENTANYL CITRATE (PF) 100 MCG/2ML IJ SOLN
INTRAMUSCULAR | Status: DC | PRN
  Administered 2015-05-21: 25 ug via INTRATHECAL
  Administered 2015-05-21: 75 ug via INTRAVENOUS

## 2015-05-21 MED ORDER — OXYTOCIN 10 UNIT/ML IJ SOLN
40.0000 [IU] | INTRAVENOUS | Status: DC | PRN
  Administered 2015-05-21: 40 [IU] via INTRAVENOUS

## 2015-05-21 MED ORDER — PHENYLEPHRINE 8 MG IN D5W 100 ML (0.08MG/ML) PREMIX OPTIME
INJECTION | INTRAVENOUS | Status: DC | PRN
  Administered 2015-05-21: 60 ug/min via INTRAVENOUS

## 2015-05-21 MED ORDER — IBUPROFEN 600 MG PO TABS: 600.0000 mg | ORAL_TABLET | Freq: Four times a day (QID) | ORAL | Status: DC | PRN

## 2015-05-21 MED ORDER — PHENYLEPHRINE 8 MG IN D5W 100 ML (0.08MG/ML) PREMIX OPTIME
INJECTION | INTRAVENOUS | Status: AC
  Filled 2015-05-21: qty 100

## 2015-05-21 MED ORDER — MORPHINE SULFATE (PF) 0.5 MG/ML IJ SOLN
INTRAMUSCULAR | Status: DC | PRN
  Administered 2015-05-21: .15 mg via INTRATHECAL

## 2015-05-21 MED ORDER — MEPERIDINE HCL 25 MG/ML IJ SOLN
INTRAMUSCULAR | Status: DC | PRN
  Administered 2015-05-21: 25 mg via INTRAVENOUS

## 2015-05-21 MED ORDER — DIPHENHYDRAMINE HCL 25 MG PO CAPS
25.0000 mg | ORAL_CAPSULE | Freq: Four times a day (QID) | ORAL | Status: DC | PRN
  Administered 2015-05-24: 25 mg via ORAL

## 2015-05-21 MED ORDER — PRENATAL MULTIVITAMIN CH
1.0000 | ORAL_TABLET | Freq: Every day | ORAL | Status: DC
  Filled 2015-05-21 (×2): qty 1

## 2015-05-21 MED ORDER — ZOLPIDEM TARTRATE 5 MG PO TABS: 5.0000 mg | ORAL_TABLET | Freq: Every evening | ORAL | Status: DC | PRN

## 2015-05-21 MED ORDER — NALBUPHINE HCL 10 MG/ML IJ SOLN: 5.0000 mg | Freq: Once | INTRAMUSCULAR | Status: DC | PRN

## 2015-05-21 MED ORDER — CITRIC ACID-SODIUM CITRATE 334-500 MG/5ML PO SOLN
30.0000 mL | Freq: Once | ORAL | Status: AC
  Administered 2015-05-21: 30 mL via ORAL
  Filled 2015-05-21: qty 15

## 2015-05-21 MED ORDER — NALOXONE HCL 0.4 MG/ML IJ SOLN: 0.4000 mg | INTRAMUSCULAR | Status: DC | PRN

## 2015-05-21 MED ORDER — ONDANSETRON HCL 4 MG/2ML IJ SOLN
4.0000 mg | Freq: Three times a day (TID) | INTRAMUSCULAR | Status: DC | PRN
  Filled 2015-05-21: qty 2

## 2015-05-21 MED ORDER — CLINDAMYCIN PHOSPHATE 900 MG/50ML IV SOLN: 900.0000 mg | Freq: Once | INTRAVENOUS | Status: DC

## 2015-05-21 MED ORDER — SODIUM CHLORIDE 0.9 % IJ SOLN: 3.0000 mL | INTRAMUSCULAR | Status: DC | PRN

## 2015-05-21 MED ORDER — SIMETHICONE 80 MG PO CHEW
80.0000 mg | CHEWABLE_TABLET | Freq: Three times a day (TID) | ORAL | Status: DC
  Administered 2015-05-22 – 2015-05-24 (×7): 80 mg via ORAL
  Filled 2015-05-21 (×7): qty 1

## 2015-05-21 MED ORDER — FENTANYL CITRATE (PF) 100 MCG/2ML IJ SOLN: 25.0000 ug | INTRAMUSCULAR | Status: DC | PRN

## 2015-05-21 MED ORDER — LACTATED RINGERS IV SOLN
INTRAVENOUS | Status: DC
  Administered 2015-05-22: 11:00:00 via INTRAVENOUS
  Administered 2015-05-22: 125 mL/h via INTRAVENOUS

## 2015-05-21 MED ORDER — OXYTOCIN 40 UNITS IN LACTATED RINGERS INFUSION - SIMPLE MED: 62.5000 mL/h | INTRAVENOUS | Status: AC

## 2015-05-21 MED ORDER — FAMOTIDINE IN NACL 20-0.9 MG/50ML-% IV SOLN
20.0000 mg | Freq: Once | INTRAVENOUS | Status: AC
  Administered 2015-05-21: 20 mg via INTRAVENOUS
  Filled 2015-05-21: qty 50

## 2015-05-21 MED ORDER — GENTAMICIN SULFATE 40 MG/ML IJ SOLN: 1.5000 mg/kg | Freq: Once | INTRAVENOUS | Status: DC

## 2015-05-21 MED ORDER — LANOLIN HYDROUS EX OINT: 1.0000 "application " | TOPICAL_OINTMENT | CUTANEOUS | Status: DC | PRN

## 2015-05-21 MED ORDER — SIMETHICONE 80 MG PO CHEW
80.0000 mg | CHEWABLE_TABLET | ORAL | Status: DC
  Administered 2015-05-22 – 2015-05-23 (×2): 80 mg via ORAL
  Filled 2015-05-21 (×3): qty 1

## 2015-05-21 MED ORDER — IBUPROFEN 600 MG PO TABS
600.0000 mg | ORAL_TABLET | Freq: Four times a day (QID) | ORAL | Status: DC
  Administered 2015-05-22 – 2015-05-23 (×5): 600 mg via ORAL
  Filled 2015-05-21 (×5): qty 1

## 2015-05-21 MED ORDER — BUPIVACAINE IN DEXTROSE 0.75-8.25 % IT SOLN
INTRATHECAL | Status: DC | PRN
  Administered 2015-05-21: 1.7 mL via INTRATHECAL

## 2015-05-21 MED ORDER — DIPHENHYDRAMINE HCL 50 MG/ML IJ SOLN: 12.5000 mg | INTRAMUSCULAR | Status: DC | PRN

## 2015-05-21 MED ORDER — GENTAMICIN SULFATE 40 MG/ML IJ SOLN
INTRAMUSCULAR | Status: AC
  Administered 2015-05-21: 115.5 mL via INTRAVENOUS
  Filled 2015-05-21: qty 9.5

## 2015-05-21 MED ORDER — LACTATED RINGERS IV SOLN
INTRAVENOUS | Status: DC
  Administered 2015-05-21: 18:00:00 via INTRAVENOUS

## 2015-05-21 MED ORDER — WITCH HAZEL-GLYCERIN EX PADS: 1.0000 "application " | MEDICATED_PAD | CUTANEOUS | Status: DC | PRN

## 2015-05-21 MED ORDER — ONDANSETRON HCL 4 MG/2ML IJ SOLN
INTRAMUSCULAR | Status: DC | PRN
  Administered 2015-05-21: 4 mg via INTRAVENOUS

## 2015-05-21 MED ORDER — LACTATED RINGERS IV BOLUS (SEPSIS)
1000.0000 mL | Freq: Once | INTRAVENOUS | Status: AC
  Administered 2015-05-21: 1000 mL via INTRAVENOUS

## 2015-05-21 MED ORDER — OXYTOCIN 10 UNIT/ML IJ SOLN
INTRAMUSCULAR | Status: AC
  Filled 2015-05-21: qty 4

## 2015-05-21 MED ORDER — LACTATED RINGERS IV SOLN
INTRAVENOUS | Status: DC | PRN
  Administered 2015-05-21: 19:00:00 via INTRAVENOUS

## 2015-05-21 MED ORDER — ACETAMINOPHEN 325 MG PO TABS: 650.0000 mg | ORAL_TABLET | ORAL | Status: DC | PRN

## 2015-05-21 MED ORDER — MEPERIDINE HCL 25 MG/ML IJ SOLN
INTRAMUSCULAR | Status: AC
  Filled 2015-05-21: qty 1

## 2015-05-21 MED ORDER — OXYCODONE-ACETAMINOPHEN 5-325 MG PO TABS
2.0000 | ORAL_TABLET | ORAL | Status: DC | PRN
Start: 2015-05-21 — End: 2015-05-24
  Administered 2015-05-23 – 2015-05-24 (×4): 2 via ORAL
  Filled 2015-05-21 (×4): qty 2

## 2015-05-21 MED ORDER — DIPHENHYDRAMINE HCL 25 MG PO CAPS
25.0000 mg | ORAL_CAPSULE | ORAL | Status: DC | PRN
  Filled 2015-05-21: qty 1

## 2015-05-21 MED ORDER — ONDANSETRON HCL 4 MG/2ML IJ SOLN
INTRAMUSCULAR | Status: AC
  Filled 2015-05-21: qty 2

## 2015-05-21 MED ORDER — TETANUS-DIPHTH-ACELL PERTUSSIS 5-2.5-18.5 LF-MCG/0.5 IM SUSP: 0.5000 mL | Freq: Once | INTRAMUSCULAR | Status: DC

## 2015-05-21 MED ORDER — KETOROLAC TROMETHAMINE 30 MG/ML IJ SOLN: 30.0000 mg | Freq: Four times a day (QID) | INTRAMUSCULAR | Status: AC | PRN

## 2015-05-21 SURGICAL SUPPLY — 33 items
BENZOIN TINCTURE PRP APPL 2/3 (GAUZE/BANDAGES/DRESSINGS) ×2 IMPLANT
CLAMP CORD UMBIL (MISCELLANEOUS) IMPLANT
CLOTH BEACON ORANGE TIMEOUT ST (SAFETY) ×2 IMPLANT
CONTAINER PREFILL 10% NBF 15ML (MISCELLANEOUS) IMPLANT
DRAPE SHEET LG 3/4 BI-LAMINATE (DRAPES) IMPLANT
DRSG OPSITE POSTOP 4X10 (GAUZE/BANDAGES/DRESSINGS) ×2 IMPLANT
DURAPREP 26ML APPLICATOR (WOUND CARE) ×2 IMPLANT
ELECT REM PT RETURN 9FT ADLT (ELECTROSURGICAL) ×2
ELECTRODE REM PT RTRN 9FT ADLT (ELECTROSURGICAL) ×1 IMPLANT
EXTRACTOR VACUUM M CUP 4 TUBE (SUCTIONS) IMPLANT
GLOVE BIO SURGEON STRL SZ 6.5 (GLOVE) ×2 IMPLANT
GLOVE BIOGEL PI IND STRL 7.0 (GLOVE) ×2 IMPLANT
GLOVE BIOGEL PI INDICATOR 7.0 (GLOVE) ×2
GOWN STRL REUS W/TWL LRG LVL3 (GOWN DISPOSABLE) ×4 IMPLANT
KIT ABG SYR 3ML LUER SLIP (SYRINGE) IMPLANT
NEEDLE HYPO 22GX1.5 SAFETY (NEEDLE) IMPLANT
NEEDLE HYPO 25X5/8 SAFETYGLIDE (NEEDLE) IMPLANT
NS IRRIG 1000ML POUR BTL (IV SOLUTION) ×2 IMPLANT
PACK C SECTION WH (CUSTOM PROCEDURE TRAY) ×2 IMPLANT
PAD OB MATERNITY 4.3X12.25 (PERSONAL CARE ITEMS) ×2 IMPLANT
PENCIL SMOKE EVAC W/HOLSTER (ELECTROSURGICAL) ×2 IMPLANT
STRIP CLOSURE SKIN 1/2X4 (GAUZE/BANDAGES/DRESSINGS) ×2 IMPLANT
SUT MON AB 4-0 PS1 27 (SUTURE) ×2 IMPLANT
SUT PLAIN 0 NONE (SUTURE) IMPLANT
SUT PLAIN 2 0 XLH (SUTURE) IMPLANT
SUT VIC AB 0 CT1 36 (SUTURE) ×6 IMPLANT
SUT VIC AB 0 CTX 36 (SUTURE) ×4
SUT VIC AB 0 CTX36XBRD ANBCTRL (SUTURE) ×4 IMPLANT
SUT VIC AB 2-0 CT1 27 (SUTURE) ×1
SUT VIC AB 2-0 CT1 TAPERPNT 27 (SUTURE) ×1 IMPLANT
SYR CONTROL 10ML LL (SYRINGE) IMPLANT
TOWEL OR 17X24 6PK STRL BLUE (TOWEL DISPOSABLE) ×2 IMPLANT
TRAY FOLEY CATH SILVER 14FR (SET/KITS/TRAYS/PACK) IMPLANT

## 2015-05-21 NOTE — Progress Notes (Signed)
Brittney [name] is a 34 y.o. G2P1001 at [redacted]w[redacted]d presenting for labor. Pt notes onset contractions earlier today, mostly with back pain . Good fetal movement, No vaginal bleeding, not leaking fluid  PNCare at [facility] since 7 wks - Breech, declines version, for PCS - h/o low lying placenta, resolved at 28 wks - short interpregnancy interval, plan prog only OCs at 4 wks PP - IBS - GBS pos, ceftin allergy   Prenatal Transfer Tool  Maternal Diabetes: No Genetic Screening: Declined Maternal Ultrasounds/Referrals: Normal Fetal Ultrasounds or other Referrals:  None Maternal Substance Abuse:  No Significant Maternal Medications:  None Significant Maternal Lab Results: None     OB History    Gravida Para Term Preterm AB TAB SAB Ectopic Multiple Living   2 1 1       0 1     Past Medical History  Diagnosis Date  . Celiac disease   . Medical history non-contributory    Past Surgical History  Procedure Laterality Date  . Laparoscopy     Family History: family history includes Cancer in her maternal grandmother and mother. Social History:  reports that she has never smoked. She has never used smokeless tobacco. She reports that she does not drink alcohol or use illicit drugs.  Review of Systems - Negative except back pain and contractions     Blood pressure 120/64, pulse 75, temperature 97.6 F (36.4 C), temperature source Oral, resp. rate 18, height 5\' 7"  (1.702 m), weight 76.658 kg (169 lb), last menstrual period 08/19/2014, unknown if currently breastfeeding.  Physical Exam: uncomfortable with contraction Gen: well appearing, no distress CV: RRR Pulm: CTAB Back: no CVAT Abd: gravid, NT, no RUQ pain LE: trace edema, equal bilaterally, non-tender Cvx: 3/ 50%/ breech  Prenatal labs: ABO, Rh:  O+ Antibody:  neg Rubella: !Error! RI RPR:   neg HBsAg:   neg HIV:   neg GBS:   pos 1 hr Glucola normal  Genetic screening declined, nl AFP Anatomy US  nl   Assessment/Plan: 34 y.o. G2P1001 at [redacted]w[redacted]d Labor, breech, for PCS Ceftin allergy, plan gent/ clinda   Devion Chriscoe A. 05/21/2015, 5:38 PM

## 2015-05-21 NOTE — Brief Op Note (Signed)
05/21/2015  7:48 PM  PATIENT:  Brittney [name]  34 y.o. female  PRE-OPERATIVE DIAGNOSIS:  Cesarean Section for breech presentation, breech in labor  POST-OPERATIVE DIAGNOSIS:  Cesarean Section for breech presentation  PROCEDURE:  Procedure(s): CESAREAN SECTION (N/A)  SURGEON:  Surgeon(s) and Role:    * Aloha Gell, MD - Primary  PHYSICIAN ASSISTANT:   ASSISTANTSDrake Leach, CNM   ANESTHESIA:   spinal  EBL:  Total I/O In: 1000 [I.V.:1000] Out: 850 [Urine:100; Blood:750]  BLOOD ADMINISTERED:none  DRAINS: Urinary Catheter (Foley)   LOCAL MEDICATIONS USED:  NONE  SPECIMEN:  No Specimen  DISPOSITION OF SPECIMEN:  N/A  COUNTS:  YES  TOURNIQUET:  * No tourniquets in log *  DICTATION: .Note written in EPIC  PLAN OF CARE: Admit to inpatient   PATIENT DISPOSITION:  PACU - hemodynamically stable.   Delay start of Pharmacological VTE agent (>24hrs) due to surgical blood loss or risk of bleeding: yes

## 2015-05-21 NOTE — MAU Note (Signed)
Pt sent to MAU for cesarean section. Pt was evaluated by Dr Jerrilyn Cairo office today and is 3 cms and breech.

## 2015-05-21 NOTE — Anesthesia Postprocedure Evaluation (Signed)
  Anesthesia Post-op Note  Patient: Brittney [name]  Procedure(s) Performed: Procedure(s) (LRB): CESAREAN SECTION (N/A)  Patient Location: PACU  Anesthesia Type: Spinal  Level of Consciousness: awake and alert   Airway and Oxygen Therapy: Patient Spontanous Breathing  Post-op Pain: mild  Post-op Assessment: Post-op Vital signs reviewed, Patient's Cardiovascular Status Stable, Respiratory Function Stable, Patent Airway and No signs of Nausea or vomiting  Last Vitals:  Filed Vitals:   05/21/15 2015  BP: 103/61  Pulse: 89  Temp:   Resp: 15    Post-op Vital Signs: stable   Complications: No apparent anesthesia complications

## 2015-05-21 NOTE — Anesthesia Preprocedure Evaluation (Signed)
Anesthesia Evaluation  Patient identified by MRN, date of birth, ID band Patient awake    Reviewed: Allergy & Precautions, NPO status , Patient's Chart, lab work & pertinent test results  Airway Mallampati: II  TM Distance: >3 FB Neck ROM: Full    Dental no notable dental hx. (+) Teeth Intact   Pulmonary neg pulmonary ROS,    Pulmonary exam normal breath sounds clear to auscultation       Cardiovascular negative cardio ROS Normal cardiovascular exam Rhythm:Regular Rate:Normal     Neuro/Psych negative neurological ROS  negative psych ROS   GI/Hepatic Neg liver ROS, GERD  ,Celiac disease    Endo/Other  negative endocrine ROS  Renal/GU negative Renal ROS  negative genitourinary   Musculoskeletal negative musculoskeletal ROS (+)   Abdominal (+) - obese,   Peds  Hematology  (+) anemia ,   Anesthesia Other Findings   Reproductive/Obstetrics (+) Pregnancy Breech presentation In labor SROM                             Anesthesia Physical Anesthesia Plan  ASA: II  Anesthesia Plan: Spinal   Post-op Pain Management:    Induction:   Airway Management Planned: Natural Airway  Additional Equipment:   Intra-op Plan:   Post-operative Plan:   Informed Consent: I have reviewed the patients History and Physical, chart, labs and discussed the procedure including the risks, benefits and alternatives for the proposed anesthesia with the patient or authorized representative who has indicated his/her understanding and acceptance.     Plan Discussed with: Anesthesiologist, CRNA and Surgeon  Anesthesia Plan Comments:         Anesthesia Quick Evaluation

## 2015-05-21 NOTE — Anesthesia Procedure Notes (Signed)
Spinal Patient location during procedure: OR Start time: 05/21/2015 6:29 PM Staffing Anesthesiologist: Josephine Igo Performed by: anesthesiologist  Preanesthetic Checklist Completed: patient identified, site marked, surgical consent, pre-op evaluation, timeout performed, IV checked, risks and benefits discussed and monitors and equipment checked Spinal Block Patient position: sitting Prep: site prepped and draped and DuraPrep Patient monitoring: heart rate, cardiac monitor, continuous pulse ox and blood pressure Approach: midline Location: L3-4 Injection technique: single-shot Needle Needle type: Sprotte  Needle gauge: 24 G Needle length: 9 cm Needle insertion depth: 5 cm Assessment Sensory level: T4 Additional Notes Patient tolerated procedure well. Adequate sensory level.

## 2015-05-21 NOTE — Op Note (Signed)
05/21/2015  7:48 PM  PATIENT:  Brittney [name]  34 y.o. female  PRE-OPERATIVE DIAGNOSIS:  Cesarean Section for breech presentation, breech in labor  POST-OPERATIVE DIAGNOSIS:  Cesarean Section for breech presentation  PROCEDURE:  Procedure(s): CESAREAN SECTION (N/A)  SURGEON:  Surgeon(s) and Role:    * Aloha Gell, MD - Primary  PHYSICIAN ASSISTANT:   ASSISTANTSDrake Leach, CNM   ANESTHESIA:   spinal  EBL:  Total I/O In: 1000 [I.V.:1000] Out: 850 [Urine:100; Blood:750]  BLOOD ADMINISTERED:none  DRAINS: Urinary Catheter (Foley)   LOCAL MEDICATIONS USED:  NONE  SPECIMEN:  No Specimen  DISPOSITION OF SPECIMEN:  N/A  COUNTS:  YES  TOURNIQUET:  * No tourniquets in log *  DICTATION: .Note written in EPIC  PLAN OF CARE: Admit to inpatient   PATIENT DISPOSITION:  PACU - hemodynamically stable.   Delay start of Pharmacological VTE agent (>24hrs) due to surgical blood loss or risk of bleeding: yes   Findings:  @BABYSEXEBC @ infant,  APGAR (1 MIN): 8   APGAR (5 MINS): 9   APGAR (10 MINS):   Normal uterus, tubes and ovaries, normal placenta. 3VC, clear amniotic fluid, frank breech  EBL: 750 cc Antibiotics:  900mg  IV Clindamycin, gentamycin Complications: none  Indications: This is a 34 y.o. year-old, G2P1  At [redacted]w[redacted]d admitted for active labor with known breech presentation for PCS. Risks benefits and alternatives of the procedure were discussed with the patient who agreed to proceed  Procedure:  After informed consent was obtained the patient was taken to the operating room where spinal anesthesia was intiated.  She was prepped and draped in the normal sterile fashion in dorsal supine position with a leftward tilt.  A foley catheter was in place.  A Pfannenstiel skin incision was made 2 cm above the pubic symphysis in the midline with the scalpel.  Dissection was carried down with the Bovie cautery until the fascia was reached. The fascia was incised in the midline. The  incision was extended laterally with the Mayo scissors. The inferior aspect of the fascial incision was grasped with the Coker clamps, elevated up and the underlying rectus muscles were dissected off sharply. The superior aspect of the fascial incision was grasped with the Coker clamps elevated up and the underlying rectus muscles were dissected off sharply.  The peritoneum was entered bluntly. The peritoneal incision was extended superiorly and inferiorly with good visualization of the bladder. The bladder blade was inserted and palpation was done to assess the fetal position and the location of the uterine vessels. A bladder flap was created sharply. The lower segment of the uterus was incised sharply with the scalpel and extended  bluntly in the cephalo-caudal fashion then sharply with bandage scissors. The infants sacrum was grasped, brought to the incision. The sacrum was delivered, then the legs in flexion, then the right then L shoulders afters sweeeping across the chest. The head was delivered in flexion. The nose and mouth were bulb suctioned. The cord was clamped and cut. The infant was handed off to the waiting pediatrician. The placenta was expressed. The uterus was exteriorized. The uterus was cleared of all clots and debris. The uterine incision was repaired with 0 Vicryl in a running locked fashion.  Several figure of 8 sutures placed at the right angle for hemostasis.  A second layer of the same suture was used in an imbricating fashion to obtain excellent hemostasis. 3 additional sutures and bovie was used for complete hemostasis. The uterus was then returned to  the abdomen, the gutters were cleared of all clots and debris. The uterine incision was reinspected and found to be hemostatic. The peritoneum was grasped and closed with 2-0 Vicryl in a running fashion. The cut muscle edges and the underside of the fascia were inspected and found to be hemostatic. The fascia was closed with 0 Vicryl in two  halves . The subcutaneous tissue was irrigated. Scarpa's layer was closed with a 2-0 plain gut suture. The skin was closed with a 4-0 Monocryl in a single layer. The patient tolerated the procedure well. Sponge lap and needle counts were correct x3 and patient was taken to the recovery room in a stable condition.  Prabhav Faulkenberry A. 05/21/2015 7:50 PM

## 2015-05-21 NOTE — Transfer of Care (Signed)
Immediate Anesthesia Transfer of Care Note  Patient: Brittney [name]  Procedure(s) Performed: Procedure(s): CESAREAN SECTION (N/A)  Patient Location: PACU  Anesthesia Type:Spinal  Level of Consciousness: awake, alert  and oriented  Airway & Oxygen Therapy: Patient Spontanous Breathing  Post-op Assessment: Report given to RN and Post -op Vital signs reviewed and stable  Post vital signs: Reviewed and stable  Last Vitals:  Filed Vitals:   05/21/15 1803  BP: 113/56  Pulse: 88  Temp: 36.4 C  Resp: 18    Complications: No apparent anesthesia complications

## 2015-05-21 NOTE — Consult Note (Signed)
Neonatology Note:   Attendance at C-section:  I was asked by Dr. Pamala Hurry to attend this C/S at term for breech presentation. +contractions.  G2P1001 at [redacted]w[redacted]d, GBS + with otherwise reassuring labs. No fever/chorio concerns. One dose Gent PTD. ROM at delivery, fluid clear. Infant vigorous with good spontaneous cry and tone. Needed only minimal bulb suctioning. Ap 8 and9. Lungs clear to ausc in DR. Noted breech positioning. To CN to care of Pediatrician. Support lactation. Hip [place] at 6 weeks corrected. Follow CDC guidelines for GBS management; observe for 48h and consider screening labs.   Monia Sabal Katherina Mires, MD

## 2015-05-22 ENCOUNTER — Encounter (HOSPITAL_COMMUNITY): Payer: Self-pay | Admitting: Obstetrics

## 2015-05-22 ENCOUNTER — Inpatient Hospital Stay (HOSPITAL_COMMUNITY): Admission: RE | Admit: 2015-05-22 | Payer: BLUE CROSS/BLUE SHIELD | Source: Ambulatory Visit

## 2015-05-22 LAB — CBC
HCT: 27.7 % — ABNORMAL LOW (ref 36.0–46.0)
HEMOGLOBIN: 9.4 g/dL — AB (ref 12.0–15.0)
MCH: 28.9 pg (ref 26.0–34.0)
MCHC: 33.6 g/dL (ref 30.0–36.0)
MCV: 86 fL (ref 78.0–100.0)
PLATELETS: 230 10*3/uL (ref 150–400)
RBC: 3.22 MIL/uL — AB (ref 3.87–5.11)
RDW: 14.4 % (ref 11.5–15.5)
WBC: 11.6 10*3/uL — AB (ref 4.0–10.5)

## 2015-05-22 LAB — RPR: RPR Ser Ql: NONREACTIVE

## 2015-05-22 MED ORDER — PROMETHAZINE HCL 25 MG/ML IJ SOLN
12.5000 mg | Freq: Four times a day (QID) | INTRAMUSCULAR | Status: DC | PRN
  Administered 2015-05-22: 12.5 mg via INTRAVENOUS
  Filled 2015-05-22 (×2): qty 1

## 2015-05-22 NOTE — Progress Notes (Signed)
POSTOPERATIVE DAY # 1 S/P CS - breech in labor  S:         Reports feeling really sore              Tolerating po intake / some nausea / + vomiting last night / no flatus / no BM             Bleeding is light             Pain not controlled - skipped pain med - just took meds             Not ambulatory yet  Newborn breast feeding     O:  VS: BP 99/55 mmHg  Pulse 78  Temp(Src) 98.2 F (36.8 C) (Oral)  Resp 18  Ht 5\' 7"  (1.702 m)  Wt 76.658 kg (169 lb)  BMI 26.46 kg/m2  SpO2 97%  LMP 08/19/2014  Breastfeeding? Unknown   LABS:               Recent Labs  05/21/15 1705 05/22/15 0515  WBC 8.1 11.6*  HGB 11.8* 9.4*  PLT 235 230               Bloodtype: --/--/O POS (11/14 1705)  Rubella:   Immune             tdap - 2016 / flu fall 2016                                          I&O: Intake/Output      11/14 0701 - 11/15 0700 11/15 0701 - 11/16 0700   I.V. (mL/kg) 2200 (28.7)    Total Intake(mL/kg) 2200 (28.7)    Urine (mL/kg/hr) 700    Blood 750    Total Output 1450     Net +750                     Physical Exam:             Alert and Oriented X3  Lungs: Clear and unlabored  Heart: regular rate and rhythm / no mumurs  Abdomen: soft, non-tender, mildly distended, hypoactive bowel sounds             Fundus: firm, non-tender, Ueven             Foley clear yellow urine             Dressing intact honeycomb              Incision:  approximated with suture / no erythema / no ecchymosis / no drainage  Extremities: no edema, no calf pain or tenderness, SCD in place  A:        POD # 1 S/P Breech            ABL anemia  P:        Routine postoperative care              Routine analgesia to avoid rebound pain / warm fluids & kpad for abdominal pain / gas             Advance activity ad lib   Artelia Laroche CNM, MSN, [facility] 05/22/2015, 8:43 AM

## 2015-05-23 DIAGNOSIS — O99019 Anemia complicating pregnancy, unspecified trimester: Secondary | ICD-10-CM

## 2015-05-23 DIAGNOSIS — D62 Acute posthemorrhagic anemia: Secondary | ICD-10-CM | POA: Diagnosis not present

## 2015-05-23 DIAGNOSIS — D509 Iron deficiency anemia, unspecified: Secondary | ICD-10-CM | POA: Diagnosis present

## 2015-05-23 LAB — BIRTH TISSUE RECOVERY COLLECTION (PLACENTA DONATION)

## 2015-05-23 MED ORDER — OXYCODONE-ACETAMINOPHEN 5-325 MG PO TABS
1.0000 | ORAL_TABLET | Freq: Once | ORAL | Status: AC
  Administered 2015-05-23: 1 via ORAL
  Filled 2015-05-23: qty 1

## 2015-05-23 MED ORDER — POLYSACCHARIDE IRON COMPLEX 150 MG PO CAPS
150.0000 mg | ORAL_CAPSULE | Freq: Every day | ORAL | Status: DC
  Administered 2015-05-23 – 2015-05-24 (×2): 150 mg via ORAL
  Filled 2015-05-23 (×2): qty 1

## 2015-05-23 MED ORDER — IBUPROFEN 800 MG PO TABS
800.0000 mg | ORAL_TABLET | Freq: Three times a day (TID) | ORAL | Status: DC
  Administered 2015-05-23 – 2015-05-24 (×3): 800 mg via ORAL
  Filled 2015-05-23 (×3): qty 1

## 2015-05-23 NOTE — Progress Notes (Signed)
POD # 2  Subjective: Pt reports feeling ok/ more pain today-was active for the last 5 hrs/ Pain not well controlled with Motrin and Percocet (only taking 1)-took 2 after last delivery and felt "weird" Tolerating po/Voiding without problems/ No n/v/ Flatus present Activity: ad lib Bleeding is light Newborn info:  Information for the patient's newborn:  Akshadha, Yanik [id number]  female   Feeding: bottle  Objective: VS:  Filed Vitals:   05/22/15 0830 05/22/15 1445 05/22/15 2000 05/23/15 0617  BP: 106/54 106/54 109/50 113/65  Pulse: 92 74 66 86  Temp: 98.3 F (36.8 C) 98.2 F (36.8 C) 98 F (36.7 C) 98.4 F (36.9 C)  TempSrc: Axillary Axillary  Oral  Resp: 18 18 20 18   Height:      Weight:      SpO2:  99% 97%     I&O: Intake/Output      11/15 0701 - 11/16 0700 11/16 0701 - 11/17 0700   I.V. (mL/kg)     Total Intake(mL/kg)     Urine (mL/kg/hr) 2270 (1.2)    Blood     Total Output 2270     Net -2270            LABS:  Recent Labs  05/21/15 1705 05/22/15 0515  WBC 8.1 11.6*  HGB 11.8* 9.4*  HCT 35.0* 27.7*  PLT 235 230    Blood type: --/--/O POS (11/14 1705) Rubella:   Immune   Physical Exam:  General: alert, cooperative and no distress CV: Regular rate and rhythm Resp: CTA bilaterally Abdomen: soft, nontender, normal bowel sounds Uterine Fundus: firm, below umbilicus, nontender Incision: Covered with Tegaderm and honeycomb dressing; no significant drainage, edema, bruising, or erythema; well approximated with suture and steri strips. Lochia: minimal Ext: extremities normal, atraumatic, no cyanosis or edema and Homans sign is negative, no sign of DVT   Assessment/: POD # 2/ G2P2002/ S/P C/Section d/t breech  Uncontrolled post-op pain IDA with compounding ABL anemia Doing well  Plan: Start Niferex Increase Motrin dosage, recommend 2 Percocet q4 hrs-consider Dilaudid po if fails this regimen Take more frequent rest breaks Continue routine post  op orders Anticipate discharge home tomorrow   Signed: Julianne Handler, Delane Ginger, MSN, CNM 05/23/2015, 11:24 AM

## 2015-05-23 NOTE — H&P (Signed)
Brittney [name] is a 34 y.o. G2P1001 at [redacted]w[redacted]d presenting for labor. Pt notes onset contractions earlier today, mostly with back pain . Good fetal movement, No vaginal bleeding, not leaking fluid  PNCare at [facility] since 7 wks - Breech, declines version, for PCS - h/o low lying placenta, resolved at 28 wks - short interpregnancy interval, plan prog only OCs at 4 wks PP - IBS - GBS pos, ceftin allergy   Prenatal Transfer Tool  Maternal Diabetes: No Genetic Screening: Declined Maternal Ultrasounds/Referrals: Normal Fetal Ultrasounds or other Referrals: None Maternal Substance Abuse: No Significant Maternal Medications: None Significant Maternal Lab Results: None     OB History    Gravida Para Term Preterm AB TAB SAB Ectopic Multiple Living   2 1 1       0 1     Past Medical History  Diagnosis Date  . Celiac disease   . Medical history non-contributory    Past Surgical History  Procedure Laterality Date  . Laparoscopy     Family History: family history includes Cancer in her maternal grandmother and mother. Social History:  reports that she has never smoked. She has never used smokeless tobacco. She reports that she does not drink alcohol or use illicit drugs.  Review of Systems - Negative except back pain and contractions     Blood pressure 120/64, pulse 75, temperature 97.6 F (36.4 C), temperature source Oral, resp. rate 18, height 5\' 7"  (1.702 m), weight 76.658 kg (169 lb), last menstrual period 08/19/2014, unknown if currently breastfeeding.  Physical Exam: uncomfortable with contraction Gen: well appearing, no distress CV: RRR Pulm: CTAB Back: no CVAT Abd: gravid, NT, no RUQ pain LE: trace edema, equal bilaterally, non-tender Cvx: 3/ 50%/ breech  Prenatal labs: ABO, Rh:  O+ Antibody:  neg Rubella: !Error! RI RPR:   neg HBsAg:   neg HIV:   neg GBS:   pos 1 hr Glucola normal Genetic screening  declined, nl AFP Anatomy US nl   Assessment/Plan: 34 y.o. G2P1001 at [redacted]w[redacted]d Labor, breech, for PCS Ceftin allergy, plan gent/ clinda  Nikya Busler A. 05/21/2015, 5:38 PM

## 2015-05-23 NOTE — Plan of Care (Signed)
Problem: Coping: Goal: Ability to cope will improve Outcome: Progressing Patient anxious about experiencing more incisional pain today and expressing concern over feeling woozy when taking two Percocet. Pain management discussed with family and M. Drake Leach, CNM.   Patient also anxious and tearful about her daughter who is ill with vomiting today. Mother and Mother-in-Law attending patient today.

## 2015-05-24 ENCOUNTER — Inpatient Hospital Stay (HOSPITAL_COMMUNITY): Admission: RE | Admit: 2015-05-24 | Payer: BLUE CROSS/BLUE SHIELD | Source: Ambulatory Visit | Admitting: Obstetrics

## 2015-05-24 ENCOUNTER — Encounter (HOSPITAL_COMMUNITY): Admission: RE | Payer: Self-pay | Source: Ambulatory Visit

## 2015-05-24 ENCOUNTER — Inpatient Hospital Stay (HOSPITAL_COMMUNITY): Payer: BLUE CROSS/BLUE SHIELD

## 2015-05-24 SURGERY — Surgical Case
Anesthesia: Regional

## 2015-05-24 MED ORDER — OXYCODONE-ACETAMINOPHEN 5-325 MG PO TABS: 1.0000 | ORAL_TABLET | Freq: Four times a day (QID) | ORAL | Status: DC | PRN

## 2015-05-24 MED ORDER — POLYSACCHARIDE IRON COMPLEX 150 MG PO CAPS
150.0000 mg | ORAL_CAPSULE | Freq: Every day | ORAL | Status: DC
Start: 2015-05-24 — End: 2015-12-12

## 2015-05-24 MED ORDER — IBUPROFEN 800 MG PO TABS: 800.0000 mg | ORAL_TABLET | Freq: Three times a day (TID) | ORAL | Status: DC

## 2015-05-24 MED ORDER — OXYCODONE-ACETAMINOPHEN 5-325 MG PO TABS
1.0000 | ORAL_TABLET | ORAL | Status: DC | PRN
Start: 2015-05-24 — End: 2015-12-12

## 2015-05-24 NOTE — Discharge Summary (Signed)
POSTOPERATIVE DISCHARGE SUMMARY:  Patient ID: Brittney [name] MRN: [id number] DOB/AGE: [birth year] 36 y.o.  Admit date: 05/21/2015 Admission Diagnoses: 39.2 weeks / breech / labor  Discharge date:  05/24/2015 Discharge Diagnoses: POD 3 s/p primary cesarean section for breech in labor  Prenatal history: G2P2002   EDC : 05/26/2015, by Last Menstrual Period  Prenatal care at [facility]  Primary provider : Pamala Hurry Prenatal course complicated by hx 3rd degree repair / breech  Prenatal Labs: ABO, Rh: --/--/O POS (11/14 1705)  Antibody: NEG (11/14 1705) Rubella:  Immune RPR: Non Reactive (11/14 1705)  HBsAg:   negative HIV: Non-reactive (04/21 0000)  GBS: Negative (06/06 0000)   Medical / Surgical History :  Past medical history:  Past Medical History  Diagnosis Date  . Celiac disease   . Medical history non-contributory     Past surgical history:  Past Surgical History  Procedure Laterality Date  . Laparoscopy    . Cesarean section N/A 05/21/2015    Procedure: CESAREAN SECTION;  Surgeon: Aloha Gell, MD;  Location: [facility];  Service: Obstetrics;  Laterality: N/A;    Family History:  Family History  Problem Relation Age of Onset  . Cancer Mother   . Cancer Maternal Grandmother     Social History:  reports that she has never smoked. She has never used smokeless tobacco. She reports that she does not drink alcohol or use illicit drugs.  Allergies: Gluten meal and Ceftin   Current Medications at time of admission:  Prior to Admission medications   Medication Sig Start Date End Date Taking? Authorizing Provider  Prenatal Vit-Fe Fumarate-FA (PRENATAL MULTIVITAMIN) TABS tablet Take 1 tablet by mouth daily.    Yes Historical Provider, MD  ibuprofen (ADVIL,MOTRIN) 800 MG tablet Take 1 tablet (800 mg total) by mouth every 8 (eight) hours. 05/24/15   Artelia Laroche, CNM  iron polysaccharides (NIFEREX) 150 MG capsule Take 1 capsule (150 mg total) by mouth  daily. 05/24/15   Artelia Laroche, CNM   Procedures: Cesarean section delivery on 11/14 with delivery of female newborn by Dr Pamala Hurry   See operative report for further details APGAR (1 MIN): 8   APGAR (5 MINS): 9    Postoperative / postpartum course:  Uncomplicated with discharge on POD 3   Discharge Instructions:  Discharged Condition: stable  Activity: pelvic rest and postoperative restrictions x 2   Diet: routine  Medications:    Medication List    TAKE these medications        ibuprofen 800 MG tablet  Commonly known as:  ADVIL,MOTRIN  Take 1 tablet (800 mg total) by mouth every 8 (eight) hours.     iron polysaccharides 150 MG capsule  Commonly known as:  NIFEREX  Take 1 capsule (150 mg total) by mouth daily.     oxyCODONE-acetaminophen 5-325 MG tablet  Commonly known as:  PERCOCET/ROXICET  Take 1-2 tablets by mouth every 6 (six) hours as needed (for pain scale 4-7).     oxyCODONE-acetaminophen 5-325 MG tablet  Commonly known as:  PERCOCET/ROXICET  Take 1-2 tablets by mouth every 4 (four) hours as needed (for pain scale greater than 7).     prenatal multivitamin Tabs tablet  Take 1 tablet by mouth daily.        Wound Care: keep clean and dry / remove honeycomb POD 5 Postpartum Instructions: [facility] discharge booklet - instructions reviewed  Discharge to: Home  Follow up :   [facility] in 6 weeks for routine postpartum  visit with Dr Pamala Hurry                Signed: Artelia Laroche CNM, MSN, [facility] 05/24/2015, 10:29 AM

## 2015-05-24 NOTE — Progress Notes (Signed)
POSTOPERATIVE DAY # 3 S/P CS  S:         Reports feeling better today             Tolerating po intake / no nausea / no vomiting / + flatus / + BM             Bleeding is light             Pain controlled with motrin and percocet             Up ad lib / ambulatory/ voiding QS  Newborn bottle feeding / female  O:  VS: BP 110/61 mmHg  Pulse 75  Temp(Src) 98.5 F (36.9 C) (Oral)  Resp 16  Ht 5\' 7"  (1.702 m)  Wt 76.658 kg (169 lb)  BMI 26.46 kg/m2  SpO2 97%  LMP 08/19/2014  Breastfeeding? Unknown   LABS:               Recent Labs  05/21/15 1705 05/22/15 0515  WBC 8.1 11.6*  HGB 11.8* 9.4*  PLT 235 230               Physical Exam:             Alert and Oriented X3  Lungs: Clear and unlabored  Heart: regular rate and rhythm / no mumurs  Abdomen: soft, non-tender, non-distended              Fundus: firm, non-tender, Ueven             Dressing intact honeycomb              Incision:  approximated with suture / no erythema / no ecchymosis / no drainage  Perineum: intact  Lochia: light  Extremities: no edema, no calf pain or tenderness, negtiave Homans  A:        POD # 3 S/P CS              P:        Routine postoperative care              DC home - [facility] booklet - instructions reviewed     Artelia Laroche CNM, MSN, [facility] 05/24/2015, 10:23 AM

## 2015-12-12 ENCOUNTER — Ambulatory Visit (INDEPENDENT_AMBULATORY_CARE_PROVIDER_SITE_OTHER): Payer: BLUE CROSS/BLUE SHIELD | Admitting: Podiatry

## 2015-12-12 ENCOUNTER — Ambulatory Visit: Payer: BLUE CROSS/BLUE SHIELD | Admitting: Podiatry

## 2015-12-12 ENCOUNTER — Encounter: Payer: Self-pay | Admitting: Podiatry

## 2015-12-12 VITALS — BP 108/62 | HR 79 | Resp 16

## 2015-12-12 DIAGNOSIS — B351 Tinea unguium: Secondary | ICD-10-CM | POA: Diagnosis not present

## 2015-12-12 DIAGNOSIS — L600 Ingrowing nail: Secondary | ICD-10-CM

## 2015-12-12 MED ORDER — NEOMYCIN-POLYMYXIN-HC 1 % OT SOLN: OTIC | Status: DC

## 2015-12-12 NOTE — Progress Notes (Signed)
She presents today with her husband with a chief complaint of a painful hallux nail right. She has had his nail removed previously only for it to grow back thick and discolored and painful. She denies fever chills nausea vomiting muscle aches and pains.  Objective: Vital signs stable alert and oriented 3 have reviewed her past mental history medications allergies surgeries and social history. Pulses are strongly palpable. Neurologic sensorium is intact cutaneous evaluation does demonstrates thick yellow dystrophic onychomycotic nail that is painful on palpation and on motion. There is some subungual hematoma present proximally there is no signs of infection around the skin. No purulence no malodor.  Assessment: Nail dystrophy painful in nature. Ingrown.  Plan: Total nail avulsion with total matrixectomy hallux right. This was provided today after local anesthesia was administered she tolerated the procedure well without complication. She was given both oral and written home-going instructions for care and soaking of her toe and a prescription for Cortisporin Otic was sent to her pharmacy. I will follow-up with her in 1 week.

## 2015-12-12 NOTE — Patient Instructions (Signed)

## 2015-12-19 ENCOUNTER — Ambulatory Visit (INDEPENDENT_AMBULATORY_CARE_PROVIDER_SITE_OTHER): Payer: BLUE CROSS/BLUE SHIELD | Admitting: Podiatry

## 2015-12-19 ENCOUNTER — Encounter: Payer: Self-pay | Admitting: Podiatry

## 2015-12-19 DIAGNOSIS — L600 Ingrowing nail: Secondary | ICD-10-CM

## 2015-12-19 NOTE — Patient Instructions (Signed)

## 2015-12-19 NOTE — Progress Notes (Signed)
She presents today for follow-up of her nail avulsion hallux right. She states that seems to be healing very well. Betadine warm water and apply works as directed.  Objective: Vital signs are stable she is alert and oriented 3 no erythema edema cellulitis drainage or odor. Granulation and epithelialization is occurring overlying the nailbed hallux right no overlying erythema cellulitis drainage or odor.  Assessment: Well-healing surgical toe hallux right.  Plan: Follow-up in 2 weeks for nail avulsion. Discontinue Betadine soaked with Epsom salts and warm water soaks continue to cover during the day believe at bedtime. She likes to dress her on toe.

## 2015-12-20 ENCOUNTER — Telehealth: Payer: Self-pay | Admitting: *Deleted

## 2015-12-20 NOTE — Telephone Encounter (Addendum)
Pt states Dr. Milinda Pointer said she could switch from betadine to epsom salt soaks, but they are too burny and has switched back to betadine.  I told pt to use between 1/4 to 1/2 cups of epsom salt to quart of warm water for 10 - 15 mins twice daily and that as the areas began to dry the burning would decrease.  Pt states understanding. 01/21/2016-Pt states her toe is a little red, and still draining since 12/12/2015 toenail procedure, what can she do to make it heal faster. I told pt to go back to the [facility] soaks daily, probably won't burn at this time, after the soak apply neosporin bandaid, and allow to air dry when in bed.  Pt states is a little pink, with clear drainage, and should she use the bandaids with the neosporin ointment already on them.  I told pt the epsom salt soaks daily should help dry up the drainage and the premedicated neosporin bandaid may work well at this time because neosporin ointment adds moisture to a already moist area, so the premedicated bandaids would give the protection of the antibiotic without the added moisture.

## 2016-01-02 ENCOUNTER — Ambulatory Visit (INDEPENDENT_AMBULATORY_CARE_PROVIDER_SITE_OTHER): Payer: BLUE CROSS/BLUE SHIELD | Admitting: Podiatry

## 2016-01-02 ENCOUNTER — Encounter: Payer: Self-pay | Admitting: Podiatry

## 2016-01-02 DIAGNOSIS — L600 Ingrowing nail: Secondary | ICD-10-CM

## 2016-01-02 NOTE — Patient Instructions (Signed)

## 2016-01-03 NOTE — Progress Notes (Signed)
She presents today for follow-up of a matrixectomy hallux right. It appears to be healing better she says seems to be getting better all the time. She continues to soak twice a day.  Objective: Our signs are stable she is alert and oriented 3 there is no erythema or edema cellulitis drainage or odor. There is epithelialization occurring appears to be healing well.  Assessment: Well-healing surgical toe hallux right  Plan: Soak once every other day in Epsom salts and warm water cover during the daytime and leave open at night time continue soak and shakiness regularly until completely healed.

## 2016-01-28 ENCOUNTER — Ambulatory Visit (INDEPENDENT_AMBULATORY_CARE_PROVIDER_SITE_OTHER): Payer: BLUE CROSS/BLUE SHIELD | Admitting: Podiatry

## 2016-01-28 ENCOUNTER — Encounter: Payer: Self-pay | Admitting: Podiatry

## 2016-01-28 ENCOUNTER — Telehealth: Payer: Self-pay | Admitting: Sports Medicine

## 2016-01-28 DIAGNOSIS — L03031 Cellulitis of right toe: Secondary | ICD-10-CM

## 2016-01-28 DIAGNOSIS — L600 Ingrowing nail: Secondary | ICD-10-CM | POA: Diagnosis not present

## 2016-01-28 NOTE — Telephone Encounter (Signed)
Patient call on yesterday stating that she hit her Right big toe at the area of previous nail procedure and that it is bleeding and appears as if some of the healed skin has come off. Advised patient to soak with betadine and to dress area with topical antibiotic cream and a guaze or bandaid dressing. Advised patient to call office for follow up appointment to be seen for further evaluation. Patient expressed understanding. -Dr. Cannon Kettle

## 2016-01-28 NOTE — Progress Notes (Signed)
She presents today after her toddler had recently stepped on her hallux where her matrixectomy was performed. She states that it is still healing after 6-7 weeks in the top or stepped on it peeling this Back.  Objective: Vital signs stable alert and oriented 3 granulation tissue is present there is no erythema cellulitis drainage or odor.  Assessment: Well-healed surgical toe hallux left after trauma.  Plan: Start soaking Epsom salts and warm water soaks once daily covered in the day and leave open at bedtime.

## 2016-08-19 DIAGNOSIS — J069 Acute upper respiratory infection, unspecified: Secondary | ICD-10-CM | POA: Diagnosis not present

## 2016-08-22 DIAGNOSIS — Z0000 Encounter for general adult medical examination without abnormal findings: Secondary | ICD-10-CM | POA: Diagnosis not present

## 2016-08-29 DIAGNOSIS — Z0000 Encounter for general adult medical examination without abnormal findings: Secondary | ICD-10-CM | POA: Diagnosis not present

## 2016-09-08 ENCOUNTER — Encounter: Payer: Self-pay | Admitting: Emergency Medicine

## 2016-09-08 ENCOUNTER — Emergency Department
Admission: EM | Admit: 2016-09-08 | Discharge: 2016-09-08 | Disposition: A | Discharge status: Home / Self Care | Payer: BLUE CROSS/BLUE SHIELD | Attending: Emergency Medicine | Admitting: Emergency Medicine

## 2016-09-08 DIAGNOSIS — R197 Diarrhea, unspecified: Secondary | ICD-10-CM | POA: Diagnosis not present

## 2016-09-08 DIAGNOSIS — R112 Nausea with vomiting, unspecified: Secondary | ICD-10-CM | POA: Diagnosis not present

## 2016-09-08 DIAGNOSIS — K529 Noninfective gastroenteritis and colitis, unspecified: Secondary | ICD-10-CM | POA: Diagnosis not present

## 2016-09-08 LAB — CBC
HCT: 38.8 % (ref 35.0–47.0)
Hemoglobin: 13.2 g/dL (ref 12.0–16.0)
MCH: 29.3 pg (ref 26.0–34.0)
MCHC: 34.1 g/dL (ref 32.0–36.0)
MCV: 85.9 fL (ref 80.0–100.0)
PLATELETS: 198 10*3/uL (ref 150–440)
RBC: 4.52 MIL/uL (ref 3.80–5.20)
RDW: 13.7 % (ref 11.5–14.5)
WBC: 6.2 10*3/uL (ref 3.6–11.0)

## 2016-09-08 LAB — COMPREHENSIVE METABOLIC PANEL
ALK PHOS: 64 U/L (ref 38–126)
ALT: 23 U/L (ref 14–54)
AST: 40 U/L (ref 15–41)
Albumin: 4.2 g/dL (ref 3.5–5.0)
Anion gap: 6 (ref 5–15)
BILIRUBIN TOTAL: 0.6 mg/dL (ref 0.3–1.2)
BUN: 15 mg/dL (ref 6–20)
CALCIUM: 8.7 mg/dL — AB (ref 8.9–10.3)
CO2: 26 mmol/L (ref 22–32)
CREATININE: 0.71 mg/dL (ref 0.44–1.00)
Chloride: 106 mmol/L (ref 101–111)
GFR calc Af Amer: >60 mL/min (ref >60)
Glucose, Bld: 109 mg/dL — ABNORMAL HIGH (ref 65–99)
POTASSIUM: 3.7 mmol/L (ref 3.5–5.1)
Sodium: 138 mmol/L (ref 135–145)
TOTAL PROTEIN: 7.4 g/dL (ref 6.5–8.1)

## 2016-09-08 LAB — URINALYSIS, COMPLETE (UACMP) WITH MICROSCOPIC
BILIRUBIN URINE: NEGATIVE
Bacteria, UA: NONE SEEN
Glucose, UA: NEGATIVE mg/dL
HGB URINE DIPSTICK: NEGATIVE
Ketones, ur: NEGATIVE mg/dL
LEUKOCYTES UA: NEGATIVE
NITRITE: NEGATIVE
PH: 5 (ref 5.0–8.0)
Protein, ur: 30 mg/dL — AB
SPECIFIC GRAVITY, URINE: 1.028 (ref 1.005–1.030)

## 2016-09-08 LAB — POCT PREGNANCY, URINE: Preg Test, Ur: NEGATIVE

## 2016-09-08 LAB — LIPASE, BLOOD: Lipase: <10 U/L — ABNORMAL LOW (ref 11–51)

## 2016-09-08 MED ORDER — DEXTROSE 5 % IV BOLUS
1000.0000 mL | Freq: Once | INTRAVENOUS | Status: AC
  Administered 2016-09-08: 1000 mL via INTRAVENOUS

## 2016-09-08 MED ORDER — ONDANSETRON 4 MG PO TBDP: 4.0000 mg | ORAL_TABLET | Freq: Three times a day (TID) | ORAL | 0 refills | Status: DC | PRN

## 2016-09-08 MED ORDER — ACETAMINOPHEN 325 MG PO TABS
650.0000 mg | ORAL_TABLET | Freq: Once | ORAL | Status: AC
  Administered 2016-09-08: 650 mg via ORAL
  Filled 2016-09-08: qty 2

## 2016-09-08 MED ORDER — LOPERAMIDE HCL 2 MG PO TABS: 4.0000 mg | ORAL_TABLET | Freq: Four times a day (QID) | ORAL | 0 refills | Status: DC | PRN

## 2016-09-08 MED ORDER — LOPERAMIDE HCL 2 MG PO CAPS
4.0000 mg | ORAL_CAPSULE | Freq: Once | ORAL | Status: AC
  Administered 2016-09-08: 4 mg via ORAL
  Filled 2016-09-08: qty 2

## 2016-09-08 MED ORDER — ONDANSETRON HCL 4 MG/2ML IJ SOLN
4.0000 mg | Freq: Once | INTRAMUSCULAR | Status: AC
  Administered 2016-09-08: 4 mg via INTRAVENOUS
  Filled 2016-09-08: qty 2

## 2016-09-08 NOTE — ED Notes (Signed)
Pt's iv not running when this nurse entered room to do po challenge. IV line flushed and fluid began to flow. Pt given ginger ale and encouraged to do sips as she can. Pt refuses crackers as she has celiac disease.

## 2016-09-08 NOTE — ED Provider Notes (Signed)
[facility] Emergency Department Provider Note  ____________________________________________  Time seen: Approximately 12:40 PM  I have reviewed the triage vital signs and the nursing notes.   HISTORY  Chief Complaint Emesis; Diarrhea; and Nausea    HPI Brittney [name] is a 36 y.o. female reports that over the last 2 days she's had some mild malaise. Then last night she had one episode of vomiting after dinner followed by frequent watery diarrhea. Nonbloody. Complains of generalized abdominal pain as well. She ate a 7 dinner with the rest of her family that she prepared herself. She is made this meal multiple times before. No one else has been sick last night, but her infant daughter was recently sick with an ear infection, started on Augmentin and then developed diarrhea. The patient has been exposed to that diarrhea while changing diapers. She also had a frosty at [facility] 2 days ago. Denies body aches fever or chills. No dizziness or syncope. No chest pain or shortness breath.  She and her husband have both had sore throat for the past 24 hours as well.   Past Medical History:  Diagnosis Date  . Celiac disease   . Medical history non-contributory      Patient Active Problem List   Diagnosis Date Noted  . Iron deficiency anemia of pregnancy 05/23/2015  . Acute blood loss anemia 05/23/2015  . Postpartum care following cesarean delivery (11/14) 05/21/2015  . Breech birth 05/21/2015  . Vacuum extractor delivery, delivered (11/3) 05/10/2014  . Third degree laceration of perineum during delivery, postpartum 05/10/2014  . Active labor 05/09/2014     Past Surgical History:  Procedure Laterality Date  . CESAREAN SECTION N/A 05/21/2015   Procedure: CESAREAN SECTION;  Surgeon: Aloha Gell, MD;  Location: [facility];  Service: Obstetrics;  Laterality: N/A;  . LAPAROSCOPY       Prior to Admission medications   Medication Sig Start Date End Date Taking?  Authorizing Provider  loperamide (IMODIUM A-D) 2 MG tablet Take 2 tablets (4 mg total) by mouth 4 (four) times daily as needed for diarrhea or loose stools. 09/08/16   Carrie Mew, MD  Multiple Vitamin (MULTI-VITAMINS) TABS Take by mouth.    Historical Provider, MD  NEOMYCIN-POLYMYXIN-HYDROCORTISONE (CORTISPORIN) 1 % SOLN otic solution Apply 1-2 drops to toe BID after soaking 12/12/15   Max T Hyatt, DPM  norethindrone-ethinyl estradiol (MICROGESTIN,JUNEL,LOESTRIN) 1-20 MG-MCG tablet Take by mouth.    Historical Provider, MD  ondansetron (ZOFRAN ODT) 4 MG disintegrating tablet Take 1 tablet (4 mg total) by mouth every 8 (eight) hours as needed for nausea or vomiting. 09/08/16   Carrie Mew, MD     Allergies Gluten meal and Ceftin [cefuroxime axetil]   Family History  Problem Relation Age of Onset  . Cancer Mother   . Cancer Maternal Grandmother     Social History Social History  Substance Use Topics  . Smoking status: Never Smoker  . Smokeless tobacco: Never Used  . Alcohol use No    Review of Systems  Constitutional:   No fever or chills.  ENT:   Mild sore throat. No rhinorrhea. Cardiovascular:   No chest pain. Respiratory:   No dyspnea or cough. Gastrointestinal:   Positive generalized abdominal pain with vomiting and diarrhea as above.  Genitourinary:   Negative for dysuria or difficulty urinating. Musculoskeletal:   Negative for focal pain or swelling Neurological:   Negative for headaches 10-point ROS otherwise negative.  ____________________________________________   PHYSICAL EXAM:  VITAL SIGNS: ED  Triage Vitals  Enc Vitals Group     BP 09/08/16 1017 (!) 100/59     Pulse Rate 09/08/16 1017 90     Resp 09/08/16 1017 18     Temp 09/08/16 1017 99.9 F (37.7 C)     Temp Source 09/08/16 1017 Oral     SpO2 09/08/16 1017 96 %     Weight 09/08/16 1017 140 lb (63.5 kg)     Height 09/08/16 1017 5\' 7"  (1.702 m)     Head Circumference --      Peak Flow --       Pain Score 09/08/16 1020 7     Pain Loc --      Pain Edu? --      Excl. in [place]? --     Vital signs reviewed, nursing assessments reviewed.   Constitutional:   Alert and oriented. Well appearing and in no distress. Eyes:   No scleral icterus. No conjunctival pallor. PERRL. EOMI.  No nystagmus. ENT   Head:   Normocephalic and atraumatic.   Nose:   No congestion/rhinnorhea. No septal hematoma   Mouth/Throat:   Dry mucous membranes, no pharyngeal erythema. No peritonsillar mass.    Neck:   No stridor. No SubQ emphysema. No meningismus. Hematological/Lymphatic/Immunilogical:   No cervical lymphadenopathy. Cardiovascular:   RRR. Symmetric bilateral radial and DP pulses.  No murmurs.  Respiratory:   Normal respiratory effort without tachypnea nor retractions. Breath sounds are clear and equal bilaterally. No wheezes/rales/rhonchi. Gastrointestinal:   Soft with diffuse left-sided abdominal tenderness, mild. No tenderness in the right upper quadrant or right lower quadrant.. Non distended. There is no CVA tenderness.  No rebound, rigidity, or guarding. Genitourinary:   deferred Musculoskeletal:   Normal range of motion in all extremities. No joint effusions.  No lower extremity tenderness.  No edema. Neurologic:   Normal speech and language.  CN 2-10 normal. Motor grossly intact. No gross focal neurologic deficits are appreciated.  Skin:    Skin is warm, dry and intact. No rash noted.  No petechiae, purpura, or bullae.  ____________________________________________    LABS (pertinent positives/negatives) (all labs ordered are listed, but only abnormal results are displayed) Labs Reviewed  LIPASE, BLOOD - Abnormal; Notable for the following:       Result Value   Lipase <10 (*)    All other components within normal limits  COMPREHENSIVE METABOLIC PANEL - Abnormal; Notable for the following:    Glucose, Bld 109 (*)    Calcium 8.7 (*)    All other components within normal  limits  URINALYSIS, COMPLETE (UACMP) WITH MICROSCOPIC - Abnormal; Notable for the following:    Color, Urine YELLOW (*)    APPearance CLEAR (*)    Protein, ur 30 (*)    Squamous Epithelial / LPF 0-5 (*)    All other components within normal limits  CBC  POC URINE PREG, ED  POCT PREGNANCY, URINE   ____________________________________________   EKG    ____________________________________________    RADIOLOGY  No results found.  ____________________________________________   PROCEDURES Procedures  ____________________________________________   INITIAL IMPRESSION / ASSESSMENT AND PLAN / ED COURSE  Pertinent labs & imaging results that were available during my care of the patient were reviewed by me and considered in my medical decision making (see chart for details).  Patient sent to ED from walk-in clinic after they noted positive orthostatic vitals. In the ED she does appear mildly dehydrated, and urine is concentrated. We'll give IV fluids as  well as Zofran and loperamide for symptoms. History and clinical course and exam are all consistent with viral gastroenteritis. I expect this to be self limited. Her serum labs are unremarkable. Considering the patient's symptoms, medical history, and physical examination today, I have low suspicion for cholecystitis or biliary pathology, pancreatitis, perforation or bowel obstruction, hernia, intra-abdominal abscess, AAA or dissection, volvulus or intussusception, mesenteric ischemia, or appendicitis.  Suitable for outpatient follow-up once she is tolerating oral intake.         ____________________________________________   FINAL CLINICAL IMPRESSION(S) / ED DIAGNOSES  Final diagnoses:  Nausea vomiting and diarrhea  Gastroenteritis      New Prescriptions   LOPERAMIDE (IMODIUM A-D) 2 MG TABLET    Take 2 tablets (4 mg total) by mouth 4 (four) times daily as needed for diarrhea or loose stools.   ONDANSETRON (ZOFRAN  ODT) 4 MG DISINTEGRATING TABLET    Take 1 tablet (4 mg total) by mouth every 8 (eight) hours as needed for nausea or vomiting.     Portions of this note were generated with dragon dictation software. Dictation errors may occur despite best attempts at proofreading.    Carrie Mew, MD 09/08/16 [phone]

## 2016-09-08 NOTE — ED Triage Notes (Signed)
C/O general body aches last night.  Vomited last night around 2300.  Since that time patient has had multiple episodes of diarrhea.

## 2016-09-08 NOTE — ED Notes (Signed)
Pt presents with n/v/d since last night at 8PM.  Pt states she had 1 episode of vomiting, with more than 20 episodes of diarrhea. She also indicates that she has been unable to retain liquids. Abdominal "soreness" and "tenderness" reported as well. Pt pale with NAD noted.

## 2016-09-08 NOTE — ED Notes (Signed)
Patient tolerated gingerale without issue.

## 2016-12-15 DIAGNOSIS — D485 Neoplasm of uncertain behavior of skin: Secondary | ICD-10-CM | POA: Diagnosis not present

## 2016-12-15 DIAGNOSIS — Z1283 Encounter for screening for malignant neoplasm of skin: Secondary | ICD-10-CM | POA: Diagnosis not present

## 2016-12-15 DIAGNOSIS — D1800 Hemangioma unspecified site: Secondary | ICD-10-CM | POA: Diagnosis not present

## 2016-12-15 DIAGNOSIS — D229 Melanocytic nevi, unspecified: Secondary | ICD-10-CM | POA: Diagnosis not present

## 2017-01-22 DIAGNOSIS — H5213 Myopia, bilateral: Secondary | ICD-10-CM | POA: Diagnosis not present

## 2017-02-26 DIAGNOSIS — J209 Acute bronchitis, unspecified: Secondary | ICD-10-CM | POA: Diagnosis not present

## 2017-07-23 DIAGNOSIS — N943 Premenstrual tension syndrome: Secondary | ICD-10-CM | POA: Diagnosis not present

## 2017-07-23 DIAGNOSIS — Z6822 Body mass index (BMI) 22.0-22.9, adult: Secondary | ICD-10-CM | POA: Diagnosis not present

## 2017-07-23 DIAGNOSIS — R6882 Decreased libido: Secondary | ICD-10-CM | POA: Diagnosis not present

## 2017-07-23 DIAGNOSIS — Z0000 Encounter for general adult medical examination without abnormal findings: Secondary | ICD-10-CM | POA: Diagnosis not present

## 2017-07-23 DIAGNOSIS — Z1151 Encounter for screening for human papillomavirus (HPV): Secondary | ICD-10-CM | POA: Diagnosis not present

## 2017-07-23 DIAGNOSIS — Z131 Encounter for screening for diabetes mellitus: Secondary | ICD-10-CM | POA: Diagnosis not present

## 2017-07-23 DIAGNOSIS — Z1329 Encounter for screening for other suspected endocrine disorder: Secondary | ICD-10-CM | POA: Diagnosis not present

## 2017-07-23 DIAGNOSIS — Z3009 Encounter for other general counseling and advice on contraception: Secondary | ICD-10-CM | POA: Diagnosis not present

## 2017-07-23 DIAGNOSIS — Z130 Encounter for screening for diseases of the blood and blood-forming organs and certain disorders involving the immune mechanism: Secondary | ICD-10-CM | POA: Diagnosis not present

## 2017-07-23 DIAGNOSIS — Z01419 Encounter for gynecological examination (general) (routine) without abnormal findings: Secondary | ICD-10-CM | POA: Diagnosis not present

## 2017-07-23 DIAGNOSIS — Z13220 Encounter for screening for lipoid disorders: Secondary | ICD-10-CM | POA: Diagnosis not present

## 2017-09-02 DIAGNOSIS — E039 Hypothyroidism, unspecified: Secondary | ICD-10-CM | POA: Diagnosis not present

## 2017-09-02 DIAGNOSIS — E875 Hyperkalemia: Secondary | ICD-10-CM | POA: Diagnosis not present

## 2017-10-20 DIAGNOSIS — E039 Hypothyroidism, unspecified: Secondary | ICD-10-CM | POA: Diagnosis not present

## 2017-10-20 DIAGNOSIS — N943 Premenstrual tension syndrome: Secondary | ICD-10-CM | POA: Diagnosis not present

## 2017-11-26 ENCOUNTER — Emergency Department: Payer: BLUE CROSS/BLUE SHIELD

## 2017-11-26 ENCOUNTER — Encounter: Payer: Self-pay | Admitting: *Deleted

## 2017-11-26 ENCOUNTER — Emergency Department
Admission: EM | Admit: 2017-11-26 | Discharge: 2017-11-26 | Disposition: A | Discharge status: Home / Self Care | Payer: BLUE CROSS/BLUE SHIELD | Attending: Emergency Medicine | Admitting: Emergency Medicine

## 2017-11-26 ENCOUNTER — Other Ambulatory Visit: Payer: Self-pay

## 2017-11-26 DIAGNOSIS — R1031 Right lower quadrant pain: Secondary | ICD-10-CM | POA: Diagnosis present

## 2017-11-26 DIAGNOSIS — N23 Unspecified renal colic: Secondary | ICD-10-CM | POA: Diagnosis not present

## 2017-11-26 DIAGNOSIS — M545 Low back pain: Secondary | ICD-10-CM | POA: Diagnosis not present

## 2017-11-26 DIAGNOSIS — R109 Unspecified abdominal pain: Secondary | ICD-10-CM | POA: Diagnosis not present

## 2017-11-26 DIAGNOSIS — J029 Acute pharyngitis, unspecified: Secondary | ICD-10-CM | POA: Diagnosis not present

## 2017-11-26 LAB — LIPASE, BLOOD: Lipase: 25 U/L (ref 11–51)

## 2017-11-26 LAB — CBC
HEMATOCRIT: 37.9 % (ref 35.0–47.0)
HEMOGLOBIN: 13 g/dL (ref 12.0–16.0)
MCH: 30.1 pg (ref 26.0–34.0)
MCHC: 34.2 g/dL (ref 32.0–36.0)
MCV: 88 fL (ref 80.0–100.0)
Platelets: 271 10*3/uL (ref 150–440)
RBC: 4.3 MIL/uL (ref 3.80–5.20)
RDW: 13.4 % (ref 11.5–14.5)
WBC: 8 10*3/uL (ref 3.6–11.0)

## 2017-11-26 LAB — URINALYSIS, COMPLETE (UACMP) WITH MICROSCOPIC
BACTERIA UA: NONE SEEN
BILIRUBIN URINE: NEGATIVE
GLUCOSE, UA: NEGATIVE mg/dL
HGB URINE DIPSTICK: NEGATIVE
KETONES UR: NEGATIVE mg/dL
LEUKOCYTES UA: NEGATIVE
Nitrite: NEGATIVE
PROTEIN: NEGATIVE mg/dL
Specific Gravity, Urine: 1.025 (ref 1.005–1.030)
pH: 5 (ref 5.0–8.0)

## 2017-11-26 LAB — COMPREHENSIVE METABOLIC PANEL
ALT: 17 U/L (ref 14–54)
ANION GAP: 7 (ref 5–15)
AST: 29 U/L (ref 15–41)
Albumin: 4.1 g/dL (ref 3.5–5.0)
Alkaline Phosphatase: 48 U/L (ref 38–126)
BILIRUBIN TOTAL: 0.3 mg/dL (ref 0.3–1.2)
BUN: 13 mg/dL (ref 6–20)
CHLORIDE: 105 mmol/L (ref 101–111)
CO2: 24 mmol/L (ref 22–32)
Calcium: 8.9 mg/dL (ref 8.9–10.3)
Creatinine, Ser: 0.64 mg/dL (ref 0.44–1.00)
GFR calc Af Amer: >60 mL/min (ref >60)
GLUCOSE: 170 mg/dL — AB (ref 65–99)
POTASSIUM: 4.1 mmol/L (ref 3.5–5.1)
Sodium: 136 mmol/L (ref 135–145)
Total Protein: 7.4 g/dL (ref 6.5–8.1)

## 2017-11-26 LAB — POC URINE PREG, ED: Preg Test, Ur: NEGATIVE

## 2017-11-26 MED ORDER — KETOROLAC TROMETHAMINE 10 MG PO TABS: 10.0000 mg | ORAL_TABLET | Freq: Three times a day (TID) | ORAL | 0 refills | Status: AC | PRN

## 2017-11-26 MED ORDER — SODIUM CHLORIDE 0.9 % IV BOLUS
1000.0000 mL | Freq: Once | INTRAVENOUS | Status: AC
  Administered 2017-11-26: 1000 mL via INTRAVENOUS

## 2017-11-26 MED ORDER — KETOROLAC TROMETHAMINE 30 MG/ML IJ SOLN
30.0000 mg | Freq: Once | INTRAMUSCULAR | Status: AC
  Administered 2017-11-26: 30 mg via INTRAVENOUS
  Filled 2017-11-26: qty 1

## 2017-11-26 MED ORDER — ONDANSETRON 4 MG PO TBDP: 4.0000 mg | ORAL_TABLET | Freq: Three times a day (TID) | ORAL | 0 refills | Status: AC | PRN

## 2017-11-26 MED ORDER — ONDANSETRON HCL 4 MG/2ML IJ SOLN
4.0000 mg | Freq: Once | INTRAMUSCULAR | Status: AC
  Administered 2017-11-26: 4 mg via INTRAVENOUS
  Filled 2017-11-26: qty 2

## 2017-11-26 NOTE — ED Provider Notes (Signed)
[facility] Emergency Department Provider Note  ____________________________________________  Time seen: Approximately 4:49 PM  I have reviewed the triage vital signs and the nursing notes.   HISTORY  Chief Complaint Abdominal Pain and Flank Pain    HPI Brittney [name] is a 37 y.o. female my nonpregnant, presenting with left flank pain.  The patient reports that she has a severe left flank pain that radiates to the right side and around to the right lower quadrant but that the majority of her pain is in the left flank.  She reports associated nausea without vomiting.  She has not had dysuria, hematuria, urinary frequency, constipation or diarrhea, change in vaginal discharge, fevers or chills.  She has never had pain like this before.  She has not tried anything for her pain.  Past Medical History:  Diagnosis Date  . Celiac disease   . Medical history non-contributory     Patient Active Problem List   Diagnosis Date Noted  . Iron deficiency anemia of pregnancy 05/23/2015  . Acute blood loss anemia 05/23/2015  . Postpartum care following cesarean delivery (11/14) 05/21/2015  . Breech birth 05/21/2015  . Vacuum extractor delivery, delivered (11/3) 05/10/2014  . Third degree laceration of perineum during delivery, postpartum 05/10/2014  . Active labor 05/09/2014    Past Surgical History:  Procedure Laterality Date  . CESAREAN SECTION N/A 05/21/2015   Procedure: CESAREAN SECTION;  Surgeon: Aloha Gell, MD;  Location: [facility];  Service: Obstetrics;  Laterality: N/A;  . LAPAROSCOPY      Current Outpatient Rx  . Order #: [id number] Class: Print  . Order #: [id number] Class: Print  . Order #: [id number] Class: Historical Med  . Order #: [id number] Class: Normal  . Order #: [id number] Class: Historical Med  . Order #: [id number] Class: Print    Allergies Gluten meal and Ceftin [cefuroxime axetil]  Family History  Problem Relation Age of Onset  . Cancer Mother    . Cancer Maternal Grandmother     Social History Social History   Tobacco Use  . Smoking status: Never Smoker  . Smokeless tobacco: Never Used  Substance Use Topics  . Alcohol use: No  . Drug use: No    Review of Systems Constitutional: No fever/chills.  No lightheadedness or syncope. Eyes: No visual changes. ENT: No sore throat. No congestion or rhinorrhea. Cardiovascular: Denies chest pain. Denies palpitations. Respiratory: Denies shortness of breath.  No cough. Gastrointestinal: Positive left flank pain radiating to the right side including the right lower quadrant.   Positive nausea, no vomiting.  No diarrhea.  No constipation. Genitourinary: Negative for dysuria.  No hematuria.  No change in vaginal discharge. Musculoskeletal: Negative for back pain. Skin: Negative for rash. Neurological: Negative for headaches. No focal numbness, tingling or weakness.     ____________________________________________   PHYSICAL EXAM:  VITAL SIGNS: ED Triage Vitals [11/26/17 1547]  Enc Vitals Group     BP 115/66     Pulse Rate (!) 101     Resp 18     Temp 98.8 F (37.1 C)     Temp Source Oral     SpO2 100 %     Weight 150 lb (68 kg)     Height 5\' 7"  (1.702 m)     Head Circumference      Peak Flow      Pain Score 10     Pain Loc      Pain Edu?      Excl. in [place]?  Constitutional: Alert and oriented. Well appearing and in no acute distress. Answers questions appropriately. Eyes: Conjunctivae are normal.  EOMI. No scleral icterus. Head: Atraumatic. Nose: No congestion/rhinnorhea. Mouth/Throat: Mucous membranes are moist.  Neck: No stridor.  Supple.   Cardiovascular: Normal rate, regular rhythm. No murmurs, rubs or gallops.  Respiratory: Normal respiratory effort.  No accessory muscle use or retractions. Lungs CTAB.  No wheezes, rales or ronchi. Gastrointestinal: Mild low left CVA tenderness to palpation.  Soft, nontender and nondistended.  No reproducible right CVA  tenderness or abdominal tenderness to palpation.  No guarding or rebound.  No peritoneal signs. Musculoskeletal: No LE edema. Neurologic:  A&Ox3.  Speech is clear.  Face and smile are symmetric.  EOMI.  Moves all extremities well. Skin:  Skin is warm, dry and intact. No rash noted. Psychiatric: Mood and affect are normal. Speech and behavior are normal.  Normal judgement. ____________________________________________   LABS (all labs ordered are listed, but only abnormal results are displayed)  Labs Reviewed  COMPREHENSIVE METABOLIC PANEL - Abnormal; Notable for the following components:      Result Value   Glucose, Bld 170 (*)    All other components within normal limits  LIPASE, BLOOD  CBC  URINALYSIS, COMPLETE (UACMP) WITH MICROSCOPIC  POC URINE PREG, ED   ____________________________________________  EKG  Not indicated ____________________________________________  RADIOLOGY  Ct Renal Stone Study  Result Date: 11/26/2017 CLINICAL DATA:  37 year old female with right flank pain, chills and nausea EXAM: CT ABDOMEN AND PELVIS WITHOUT CONTRAST TECHNIQUE: Multidetector CT imaging of the abdomen and pelvis was performed following the standard protocol without IV contrast. COMPARISON:  None. FINDINGS: Lower chest: The lung bases are clear. Visualized cardiac structures are within normal limits for size. No pericardial effusion. Unremarkable visualized distal thoracic esophagus. Hepatobiliary: Normal hepatic contour and morphology. No discrete hepatic lesions. Normal appearance of the gallbladder. No intra or extrahepatic biliary ductal dilatation. Pancreas: Unremarkable. No pancreatic ductal dilatation or surrounding inflammatory changes. Spleen: Normal in size without focal abnormality. Adrenals/Urinary Tract: Bilateral adrenal glands are normal. The left kidney is normal. There is a punctate nonobstructing stone in the interpolar right kidney. Minimal fullness of the right renal collecting  system and upper ureter. Punctate calcification adjacent to the right posterolateral bladder appears to be more inferior and lateral than the ureteral insertion. This may represent a recently passed stone or a small vascular phleboliths. No left-sided nephrolithiasis. Stomach/Bowel: No focal bowel wall thickening or evidence of obstruction. Relatively large colonic stool burden suggests constipation. Vascular/Lymphatic: Limited evaluation in the absence of intravenous contrast. No suspicious lymphadenopathy or atherosclerotic vascular calcifications. Reproductive: Uterus and bilateral adnexa are unremarkable. Other: No abdominal wall hernia or abnormality. No abdominopelvic ascites. 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 Bilateral chronic L5 pars defects. Minimal grade 1 anterolisthesis of L5 on S1. IMPRESSION: 1. Nonobstructing punctate right interpolar renal stone. 2. Mild dilatation of the right renal collecting system and proximal ureter without definitive ureteral stone. Suspect recent passage of a small renal stone which would explain the patient's clinical symptoms. 3. Small probable venous phlebolith slightly inferior and lateral to the expected location of the ureterovesicular junction. 4. 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 Electronically Signed   By: Jacqulynn Cadet M.D.   On: 11/26/2017 17:56    ____________________________________________   PROCEDURES  Procedure(s) performed: None  Procedures  Critical Care performed: No ____________________________________________   INITIAL IMPRESSION /  ASSESSMENT AND PLAN / ED COURSE  Pertinent labs & imaging results that were available during my care of the patient were reviewed by me and considered in my medical decision making (see chart for details).  37 y.o. female, nonpregnant, presenting with severe left flank pain radiating  to the right side in the right lower quadrant.  Overall, the patient is mildly tachycardic but she is feeling uncomfortable and having pain at this time.  Her abdominal examination is reassuring.  The differential for her symptoms includes renal colic, pyelonephritis.  Vaginal infection would be much less likely as would any ovarian pathology.  On my examination she has no tenderness in the right lower quadrant; appendicitis is much less likely as well.  Consider musculoskeletal pain.  Symptomatic treatment has been initiated, laboratory studies are pending and the patient will undergo CT renal stone protocol for further evaluation.    ----------------------------------------- 6:30 PM on 11/26/2017 -----------------------------------------  The patient's laboratory studies are reassuring.  Her pregnancy test is negative, her lipase is negative, her electrolytes are within normal limits.  Her creatinine is normal.  Her white blood cell count is normal.  The patient's CT scan does reveal a right interpolar kidney stone with some mild dilatation of the right renal collecting system and proximal ureter without stone, raising the suspicion for recently passed stone.  At this time, the patient's symptoms have completely resolved.  I have discussed the likelihood or passage of renal stone with the patient and she understands that she has an additional small stone in the kidney that is not showing any signs of moving at this time.  I talked to her about preventive stone management, as well as treatment if she were to develop any pain.  Follow-up instructions as well as return precautions were discussed.  ____________________________________________  FINAL CLINICAL IMPRESSION(S) / ED DIAGNOSES  Final diagnoses:  Renal colic on right side         NEW MEDICATIONS STARTED DURING THIS VISIT:  New Prescriptions   KETOROLAC (TORADOL) 10 MG TABLET    Take 1 tablet (10 mg total) by mouth every 8 (eight)  hours as needed for moderate pain (with food).   ONDANSETRON (ZOFRAN ODT) 4 MG DISINTEGRATING TABLET    Take 1 tablet (4 mg total) by mouth every 8 (eight) hours as needed for nausea or vomiting.      Eula Listen, MD 11/26/17 [phone]

## 2017-11-26 NOTE — ED Triage Notes (Addendum)
Pt to ED reporting RLQ pain that is tender upon palpation and right flank pain. Pt was told by PCP to come to ED to rule out appendicitis or a kidney stone (urine from clinic does not have blood noted). Pt requesting to also rule out a pregnancy. Pt tearful but otherwise in NAD at this time.

## 2017-11-26 NOTE — ED Notes (Signed)
Urinalysis performed at walk in clinic and results are in computer. Urine in lab if needed.

## 2017-11-26 NOTE — Discharge Instructions (Signed)
Please drink plenty of fluids to stay well-hydrated.  In the future, if you develop pain in your side, you may take Toradol or Tylenol for pain.  If you take Toradol, please take it with food and do not take other NSAID medications including Advil, Aleve, ibuprofen or Motrin.  Please make a follow-up appointment with your primary care physician and with the urologist, Dr. Erlene Quan.  Return to the emergency department if you develop severe pain, fever, lightheadedness or fainting, vomiting, or any other symptoms concerning to you.

## 2017-12-01 ENCOUNTER — Encounter: Payer: Self-pay | Admitting: Urology

## 2017-12-01 ENCOUNTER — Ambulatory Visit: Payer: BLUE CROSS/BLUE SHIELD | Admitting: Urology

## 2017-12-01 VITALS — BP 115/76 | HR 73 | Ht 67.0 in | Wt 153.2 lb

## 2017-12-01 DIAGNOSIS — R109 Unspecified abdominal pain: Secondary | ICD-10-CM | POA: Diagnosis not present

## 2017-12-01 DIAGNOSIS — N200 Calculus of kidney: Secondary | ICD-10-CM

## 2017-12-01 LAB — MICROSCOPIC EXAMINATION
Bacteria, UA: NONE SEEN
RBC, UA: NONE SEEN /hpf (ref 0–2)
WBC, UA: NONE SEEN /hpf (ref 0–5)

## 2017-12-01 LAB — URINALYSIS, COMPLETE
Bilirubin, UA: NEGATIVE
Glucose, UA: NEGATIVE
Ketones, UA: NEGATIVE
LEUKOCYTES UA: NEGATIVE
Nitrite, UA: NEGATIVE
PH UA: 8.5 — AB (ref 5.0–7.5)
RBC, UA: NEGATIVE
Specific Gravity, UA: 1.015 (ref 1.005–1.030)
Urobilinogen, Ur: 1 mg/dL (ref 0.2–1.0)

## 2017-12-01 NOTE — Progress Notes (Signed)
12/01/2017 12:21 PM   Brittney [name] 1981-04-09 [id number]  Referring provider: Dion Body, MD [facility] [place], [id number]  Chief Complaint  Patient presents with  . New Patient (Initial Visit)    kidney stones    HPI: 37 year old female who presents to the office today for follow-up after an emergency room visit on 11/26/2017 for presumed nephrolithiasis.  Patient reports that last Thursday morning, she developed acute severe sudden onset left flank pain radiating across her right back and into her right lower quadrant.  She had associated nausea and rigors associated with this pain.  She denies any associated urinary symptoms including dysuria or gross hematuria.  She was initially seen and evaluated at urgent care and then sent on to the emergency room for imaging.  In the emergency room, she underwent further evaluation with a CT scan indicating a punctate nonobstructing right midpole stone as well as some mild fullness of the right collecting system presumably related to possibly interval he passed stone.  Urinalysis showed no evidence of microscopic blood or infection.  She was sent home with conservative management and outpatient urologic follow-up.  She continues to have right back pain and right lower quadrant pain which is less severe.  Her pain is exacerbated by change in position and with movement.  She does not recall any trauma precipitating her pain.  She has not needed to take the tramadol which was prescribed to her.  Her nausea has resolved.    No personal history of kidney stones.   PMH: Past Medical History:  Diagnosis Date  . Celiac disease   . Medical history non-contributory   . Thyroid disease     Surgical History: Past Surgical History:  Procedure Laterality Date  . CESAREAN SECTION N/A 05/21/2015   Procedure: CESAREAN SECTION;  Surgeon: Aloha Gell, MD;  Location: [facility];  Service: Obstetrics;   Laterality: N/A;  . LAPAROSCOPY      Home Medications:  Allergies as of 12/01/2017      Reactions   Gluten Meal    celiac disease   Ceftin [cefuroxime Axetil] Rash      Medication List        Accurate as of 12/01/17 12:21 PM. Always use your most recent med list.          ketorolac 10 MG tablet Commonly known as:  TORADOL Take 1 tablet (10 mg total) by mouth every 8 (eight) hours as needed for moderate pain (with food).   levothyroxine 75 MCG tablet Commonly known as:  SYNTHROID, LEVOTHROID   MULTI-VITAMINS Tabs Take by mouth.   norethindrone-ethinyl estradiol 1-20 MG-MCG tablet Commonly known as:  MICROGESTIN,JUNEL,LOESTRIN Take by mouth.   ondansetron 4 MG disintegrating tablet Commonly known as:  ZOFRAN ODT Take 1 tablet (4 mg total) by mouth every 8 (eight) hours as needed for nausea or vomiting.       Allergies:  Allergies  Allergen Reactions  . Gluten Meal     celiac disease  . Ceftin [Cefuroxime Axetil] Rash    Family History: Family History  Problem Relation Age of Onset  . Cancer Mother   . Cancer Maternal Grandmother   . Bladder Cancer Neg Hx   . Kidney cancer Neg Hx     Social History:  reports that she has never smoked. She has never used smokeless tobacco. She reports that she does not drink alcohol or use drugs.  ROS: UROLOGY Frequent Urination?: No Hard to postpone urination?:  No Burning/pain with urination?: No Get up at night to urinate?: No Leakage of urine?: No Urine stream starts and stops?: No Trouble starting stream?: No Do you have to strain to urinate?: No Blood in urine?: No Urinary tract infection?: No Sexually transmitted disease?: No Injury to kidneys or bladder?: No Painful intercourse?: No Weak stream?: No Currently pregnant?: No Vaginal bleeding?: No Last menstrual period?: continuous since March 2019  Gastrointestinal Nausea?: Yes Vomiting?: No Indigestion/heartburn?: No Diarrhea?: No Constipation?:  No  Constitutional Fever: No Night sweats?: No Weight loss?: No Fatigue?: Yes  Skin Skin rash/lesions?: No Itching?: No  Eyes Blurred vision?: Yes Double vision?: No  Ears/Nose/Throat Sore throat?: No Sinus problems?: No  Hematologic/Lymphatic Swollen glands?: No Easy bruising?: No  Cardiovascular Leg swelling?: No Chest pain?: No  Respiratory Cough?: No Shortness of breath?: No  Endocrine Excessive thirst?: No  Musculoskeletal Back pain?: Yes Joint pain?: No  Neurological Headaches?: No Dizziness?: No  Psychologic Depression?: No Anxiety?: No  Physical Exam: BP 115/76 (BP Location: Left Arm, Patient Position: Sitting, Cuff Size: Normal)   Pulse 73   Ht 5\' 7"  (1.702 m)   Wt 153 lb 3.2 oz (69.5 kg)   BMI 23.99 kg/m   Constitutional:  Alert and oriented, No acute distress. HEENT: [place] AT, moist mucus membranes.  Trachea midline, no masses. Cardiovascular: No clubbing, cyanosis, or edema. Respiratory: Normal respiratory effort, no increased work of breathing. GI: Abdomen is soft, nontender, nondistended, no abdominal masses GU: No CVA tenderness MSK: Palpable area of tenderness with reproducible severe pain over the right iliac crest, patient jumps when palpated at this level. Skin: No rashes, bruises or suspicious lesions. Neurologic: Grossly intact, no focal deficits, moving all 4 extremities. Psychiatric: Normal mood and affect.  Laboratory Data: Lab Results  Component Value Date   WBC 8.0 11/26/2017   HGB 13.0 11/26/2017   HCT 37.9 11/26/2017   MCV 88.0 11/26/2017   PLT 271 11/26/2017    Lab Results  Component Value Date   CREATININE 0.64 11/26/2017   Urinalysis    Component Value Date/Time   COLORURINE YELLOW (A) 11/26/2017 1651   APPEARANCEUR HAZY (A) 11/26/2017 1651   LABSPEC 1.025 11/26/2017 1651   PHURINE 5.0 11/26/2017 1651   GLUCOSEU NEGATIVE 11/26/2017 1651   HGBUR NEGATIVE 11/26/2017 1651   BILIRUBINUR NEGATIVE 11/26/2017  1651   KETONESUR NEGATIVE 11/26/2017 1651   PROTEINUR NEGATIVE 11/26/2017 1651   UROBILINOGEN 0.2 08/24/2009 1207   NITRITE NEGATIVE 11/26/2017 1651   LEUKOCYTESUR NEGATIVE 11/26/2017 1651    Lab Results  Component Value Date   BACTERIA NONE SEEN 11/26/2017    Pertinent Imaging: Results for orders placed during the hospital encounter of 11/26/17  CT RENAL STONE STUDY   Narrative CLINICAL DATA:  37 year old female with right flank pain, chills and nausea  EXAM: CT ABDOMEN AND PELVIS WITHOUT CONTRAST  TECHNIQUE: Multidetector CT imaging of the abdomen and pelvis was performed following the standard protocol without IV contrast.  COMPARISON:  None.  FINDINGS: Lower chest: The lung bases are clear. Visualized cardiac structures are within normal limits for size. No pericardial effusion. Unremarkable visualized distal thoracic esophagus.  Hepatobiliary: Normal hepatic contour and morphology. No discrete hepatic lesions. Normal appearance of the gallbladder. No intra or extrahepatic biliary ductal dilatation.  Pancreas: Unremarkable. No pancreatic ductal dilatation or surrounding inflammatory changes.  Spleen: Normal in size without focal abnormality.  Adrenals/Urinary Tract: Bilateral adrenal glands are normal. The left kidney is normal. There is a punctate nonobstructing stone  in the interpolar right kidney. Minimal fullness of the right renal collecting system and upper ureter. Punctate calcification adjacent to the right posterolateral bladder appears to be more inferior and lateral than the ureteral insertion. This may represent a recently passed stone or a small vascular phleboliths. No left-sided nephrolithiasis.  Stomach/Bowel: No focal bowel wall thickening or evidence of obstruction. Relatively large colonic stool burden suggests constipation.  Vascular/Lymphatic: Limited evaluation in the absence of intravenous contrast. No suspicious lymphadenopathy or  atherosclerotic vascular calcifications.  Reproductive: Uterus and bilateral adnexa are unremarkable.  Other: No abdominal wall hernia or abnormality. No abdominopelvic ascites.  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 Bilateral chronic L5 pars defects. Minimal grade 1 anterolisthesis of L5 on S1.  IMPRESSION: 1. Nonobstructing punctate right interpolar renal stone. 2. Mild dilatation of the right renal collecting system and proximal ureter without definitive ureteral stone. Suspect recent passage of a small renal stone which would explain the patient's clinical symptoms. 3. Small probable venous phlebolith slightly inferior and lateral to the expected location of the ureterovesicular junction. 4. 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   Electronically Signed   By: Jacqulynn Cadet M.D.   On: 11/26/2017 17:56    CT scan personally today and with the patient.  No evidence of ureteral calculus or ureteral obstruction  Assessment & Plan:    1. Right flank pain Based on the patient's history (crossing the midline), ongoing pain that is reproducible with palpation over the right iliac crest, I am not convinced that her episode was related to passage of any stone In addition, no evidence of microscopic blood in her urine further supporting the above I suspect an underlying MSK process  She does have a nonobstructing stone as well as some possible sequela of a recently passed stone on CT scan, however, this is not definitive and may be a red herring We discussed that there is no way to know definitively if her pain was or was not related to stone NSAIDs, heat and ice, and rest recommended, follow-up with PCP if fails to resolve  2. Right nephrolithiasis Punctate nonobstructing right midpole stone We discussed general stone prevention techniques including drinking plenty  water with goal of producing 2.5 L urine daily, increased citric acid intake, avoidance of high oxalate containing foods, and decreased salt intake.  Information about dietary recommendations given today. Patient is interested in having a KUB in 1 year to assess for any interval growth - Urinalysis, Complete - DG Abd 1 View; Future   Return in about 1 year (around 12/02/2018) for KUB.  Hollice Espy, MD  [name] [address], [place], [place] [address] [phone]

## 2017-12-02 ENCOUNTER — Ambulatory Visit: Payer: Self-pay | Admitting: Urology

## 2017-12-09 DIAGNOSIS — R6889 Other general symptoms and signs: Secondary | ICD-10-CM | POA: Diagnosis not present

## 2017-12-10 DIAGNOSIS — R6889 Other general symptoms and signs: Secondary | ICD-10-CM | POA: Diagnosis not present

## 2017-12-10 DIAGNOSIS — R509 Fever, unspecified: Secondary | ICD-10-CM | POA: Diagnosis not present

## 2017-12-14 DIAGNOSIS — D72819 Decreased white blood cell count, unspecified: Secondary | ICD-10-CM | POA: Diagnosis not present

## 2018-01-27 DIAGNOSIS — E039 Hypothyroidism, unspecified: Secondary | ICD-10-CM | POA: Diagnosis not present

## 2018-02-19 DIAGNOSIS — Z1283 Encounter for screening for malignant neoplasm of skin: Secondary | ICD-10-CM | POA: Diagnosis not present

## 2018-02-19 DIAGNOSIS — D229 Melanocytic nevi, unspecified: Secondary | ICD-10-CM | POA: Diagnosis not present

## 2018-02-19 DIAGNOSIS — D485 Neoplasm of uncertain behavior of skin: Secondary | ICD-10-CM | POA: Diagnosis not present

## 2018-02-19 DIAGNOSIS — D225 Melanocytic nevi of trunk: Secondary | ICD-10-CM | POA: Diagnosis not present

## 2018-03-09 DIAGNOSIS — E039 Hypothyroidism, unspecified: Secondary | ICD-10-CM | POA: Diagnosis not present

## 2018-05-17 DIAGNOSIS — J019 Acute sinusitis, unspecified: Secondary | ICD-10-CM | POA: Diagnosis not present

## 2018-06-23 DIAGNOSIS — J029 Acute pharyngitis, unspecified: Secondary | ICD-10-CM | POA: Diagnosis not present

## 2018-06-23 DIAGNOSIS — J069 Acute upper respiratory infection, unspecified: Secondary | ICD-10-CM | POA: Diagnosis not present

## 2018-07-28 DIAGNOSIS — Z13220 Encounter for screening for lipoid disorders: Secondary | ICD-10-CM | POA: Diagnosis not present

## 2018-07-28 DIAGNOSIS — Z01419 Encounter for gynecological examination (general) (routine) without abnormal findings: Secondary | ICD-10-CM | POA: Diagnosis not present

## 2018-07-28 DIAGNOSIS — E039 Hypothyroidism, unspecified: Secondary | ICD-10-CM | POA: Diagnosis not present

## 2018-07-28 DIAGNOSIS — Z0000 Encounter for general adult medical examination without abnormal findings: Secondary | ICD-10-CM | POA: Diagnosis not present

## 2018-07-28 DIAGNOSIS — Z6825 Body mass index (BMI) 25.0-25.9, adult: Secondary | ICD-10-CM | POA: Diagnosis not present

## 2018-07-28 DIAGNOSIS — Z130 Encounter for screening for diseases of the blood and blood-forming organs and certain disorders involving the immune mechanism: Secondary | ICD-10-CM | POA: Diagnosis not present

## 2018-07-28 DIAGNOSIS — Z131 Encounter for screening for diabetes mellitus: Secondary | ICD-10-CM | POA: Diagnosis not present

## 2018-08-03 DIAGNOSIS — H903 Sensorineural hearing loss, bilateral: Secondary | ICD-10-CM | POA: Diagnosis not present

## 2018-09-08 DIAGNOSIS — R0789 Other chest pain: Secondary | ICD-10-CM | POA: Diagnosis not present

## 2018-09-08 DIAGNOSIS — K2960 Other gastritis without bleeding: Secondary | ICD-10-CM | POA: Diagnosis not present

## 2018-09-09 DIAGNOSIS — E039 Hypothyroidism, unspecified: Secondary | ICD-10-CM | POA: Diagnosis not present

## 2018-11-08 DIAGNOSIS — R109 Unspecified abdominal pain: Secondary | ICD-10-CM | POA: Diagnosis not present

## 2018-11-08 DIAGNOSIS — E039 Hypothyroidism, unspecified: Secondary | ICD-10-CM | POA: Diagnosis not present

## 2018-12-07 ENCOUNTER — Ambulatory Visit: Payer: BLUE CROSS/BLUE SHIELD | Admitting: Urology

## 2019-01-04 DIAGNOSIS — E039 Hypothyroidism, unspecified: Secondary | ICD-10-CM | POA: Diagnosis not present

## 2019-01-08 IMAGING — CT CT RENAL STONE PROTOCOL
3 of 4 series · 8 of 46 positions shown, 15 images · non-contrast
Comparison: None.

CLINICAL DATA: 37-year-old female with right flank pain, chills and
nausea

EXAM:
CT ABDOMEN AND PELVIS WITHOUT CONTRAST
TECHNIQUE: Multidetector CT imaging of the abdomen and pelvis was performed
following the standard protocol without IV contrast.

[Series 4: lung bases · axial · 0.66mm/px · z∈[-639,-579]mm · 4 of 20 slices shown, 9 images]
[im 4/20  soft-tissue]
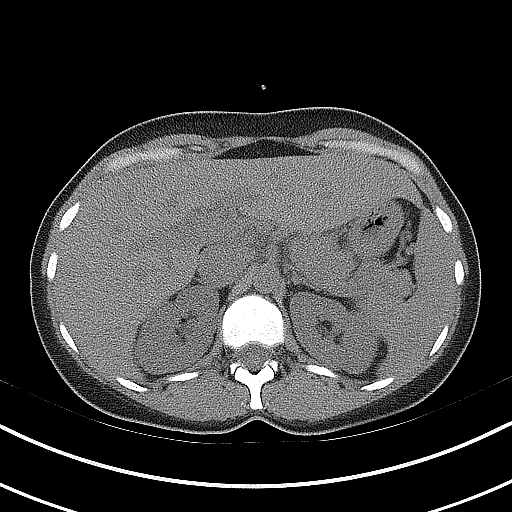
[im 4/20  lung]
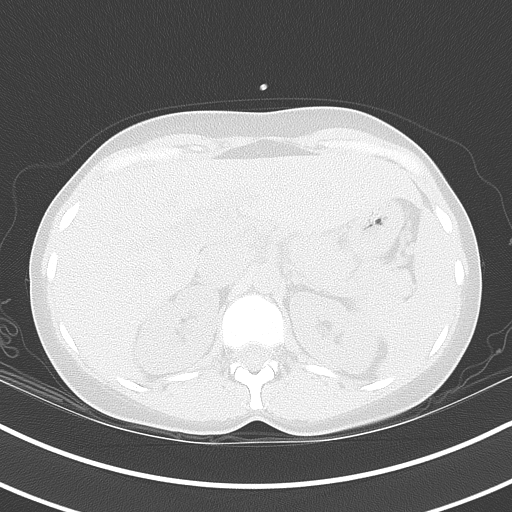
[im 4/20  bone]
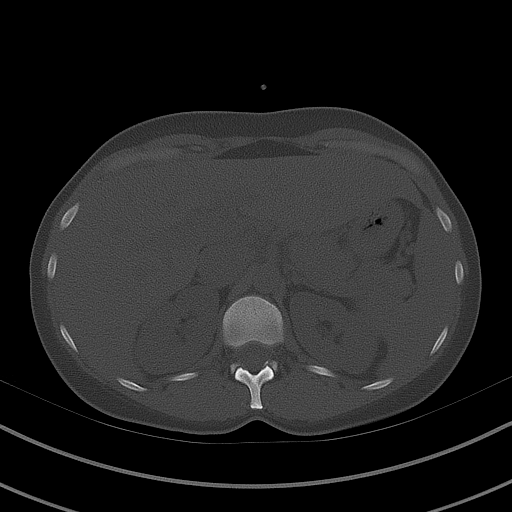
[im 8/20  soft-tissue]
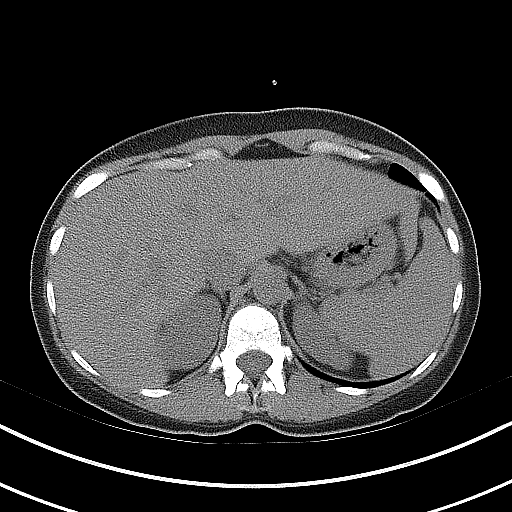
[im 8/20  lung]
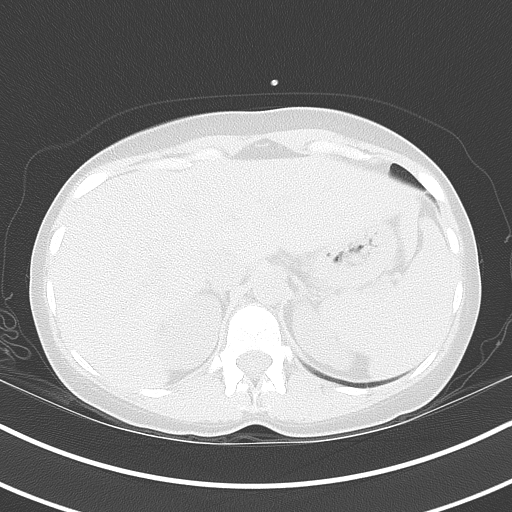
[im 12/20  soft-tissue]
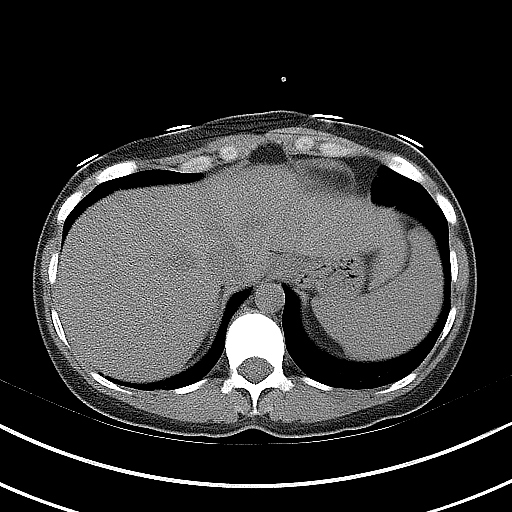
[im 12/20  lung]
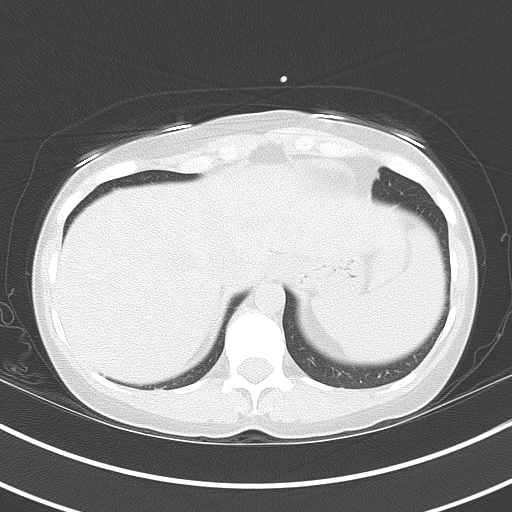
[im 16/20  soft-tissue]
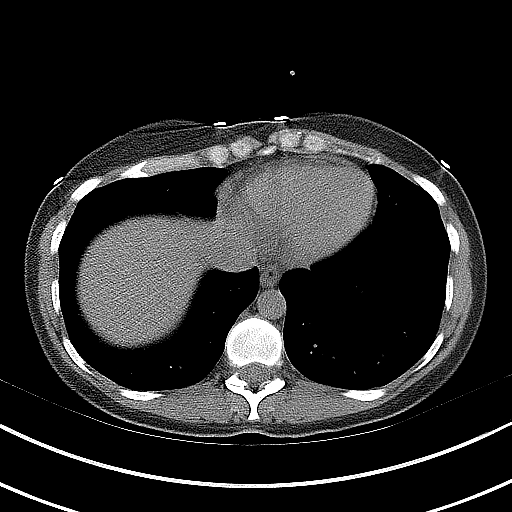
[im 16/20  lung]
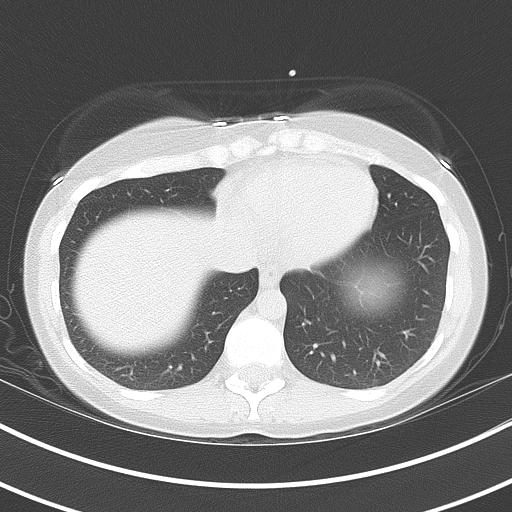

[Series 5: coronal · coronal · 0.65mm/px · 3 of 97 slices shown, 4 images]
[im 33/97  soft-tissue]
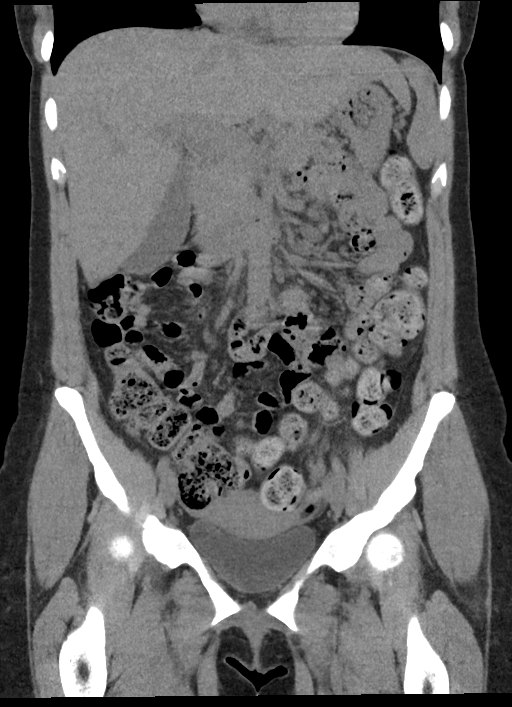
[im 43/97  soft-tissue]
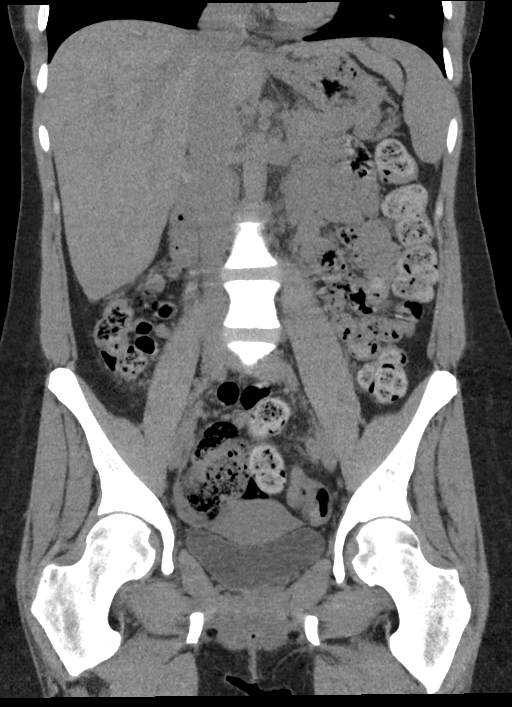
[im 43/97  bone]
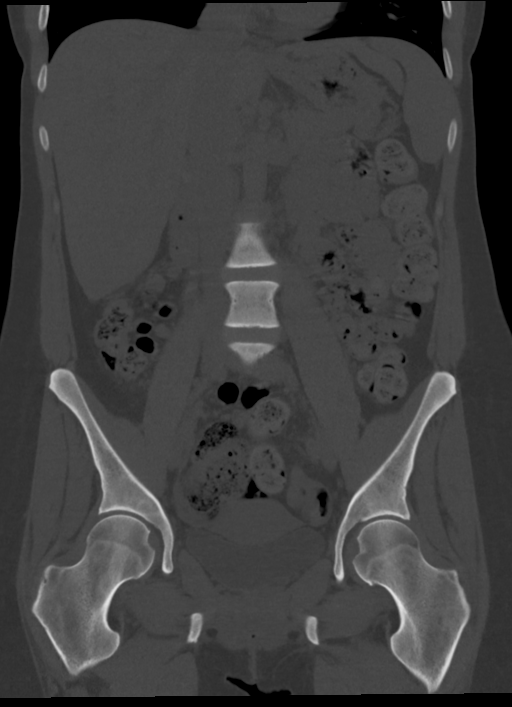
[im 54/97  soft-tissue]
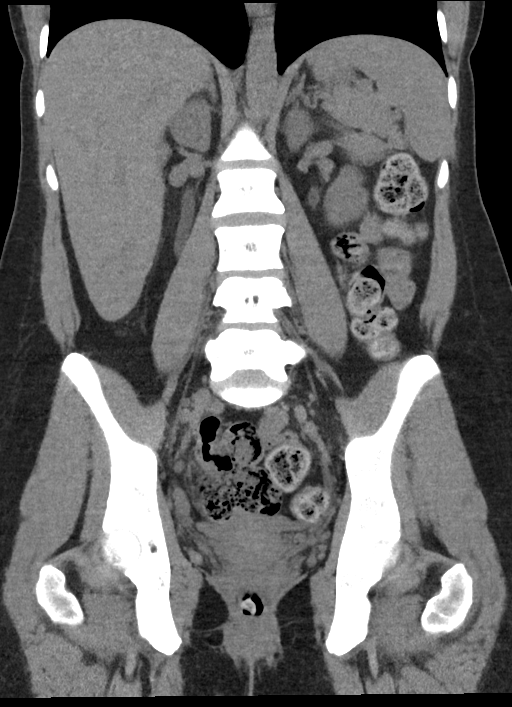

[Series 6: sagittal · sagittal · 0.50mm/px · 1 of 158 slices shown, 2 images]
[im 53/158  soft-tissue]
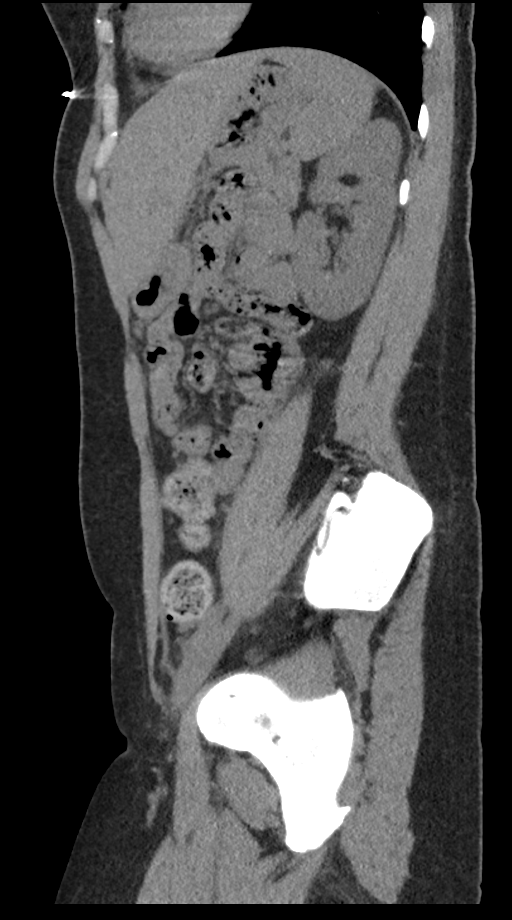
[im 53/158  bone]
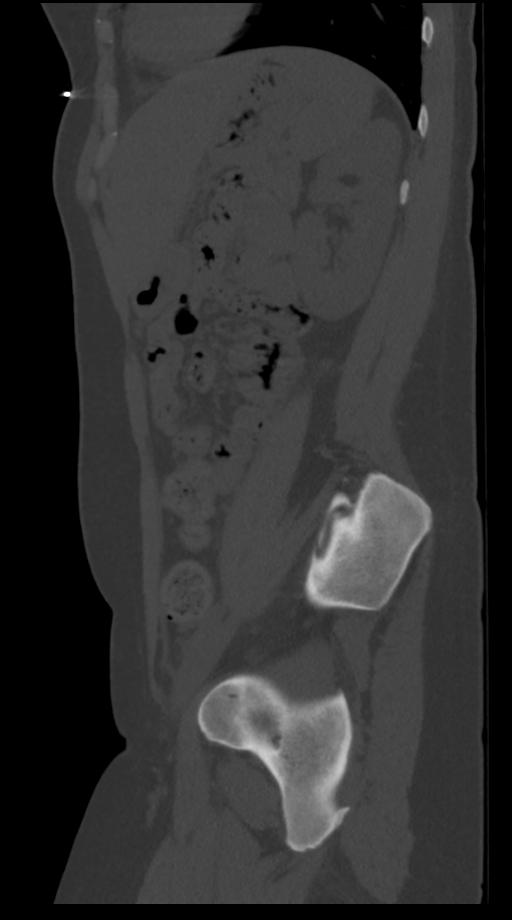

[8 of 46 positions shown; findings below may reference images not displayed]

FINDINGS: Lower chest: The lung bases are clear. Visualized cardiac structures
are within normal limits for size. No pericardial effusion.
Unremarkable visualized distal thoracic esophagus.

Hepatobiliary: Normal hepatic contour and morphology. No discrete
hepatic lesions. Normal appearance of the gallbladder. No intra or
extrahepatic biliary ductal dilatation.

Pancreas: Unremarkable. No pancreatic ductal dilatation or
surrounding inflammatory changes.

Spleen: Normal in size without focal abnormality.

Adrenals/Urinary Tract: Bilateral adrenal glands are normal. The
left kidney is normal. There is a punctate nonobstructing stone in
the interpolar right kidney. Minimal fullness of the right renal
collecting system and upper ureter. Punctate calcification adjacent
to the right posterolateral bladder appears to be more inferior and
lateral than the ureteral insertion. This may represent a recently
passed stone or a small vascular phleboliths. No left-sided
nephrolithiasis.

Stomach/Bowel: No focal bowel wall thickening or evidence of
obstruction. Relatively large colonic stool burden suggests
constipation.

Vascular/Lymphatic: Limited evaluation in the absence of intravenous
contrast. No suspicious lymphadenopathy or atherosclerotic vascular
calcifications.

Reproductive: Uterus and bilateral adnexa are unremarkable.

Other: No abdominal wall hernia or abnormality. No abdominopelvic
ascites.

Musculoskeletal: No acute or significant osseous findings.Incomplete
fusion of the posterior elements of L5. Bilateral chronic L5 pars
defects. Minimal grade 1 anterolisthesis of L5 on S1.
IMPRESSION: 1. Nonobstructing punctate right interpolar renal stone.
2. Mild dilatation of the right renal collecting system and proximal
ureter without definitive ureteral stone. Suspect recent passage of
a small renal stone which would explain the patient's clinical
symptoms.
3. Small probable venous phlebolith slightly inferior and lateral to
the expected location of the ureterovesicular junction.
4. Chronic bilateral L5 pars defects with mild grade 1
anterolisthesis of L5 on S1.

## 2019-01-27 DIAGNOSIS — Z1231 Encounter for screening mammogram for malignant neoplasm of breast: Secondary | ICD-10-CM | POA: Diagnosis not present

## 2019-03-11 DIAGNOSIS — E039 Hypothyroidism, unspecified: Secondary | ICD-10-CM | POA: Diagnosis not present

## 2019-03-21 ENCOUNTER — Other Ambulatory Visit: Payer: Self-pay | Admitting: Obstetrics

## 2019-03-29 DIAGNOSIS — H16142 Punctate keratitis, left eye: Secondary | ICD-10-CM | POA: Diagnosis not present

## 2019-03-29 DIAGNOSIS — Z803 Family history of malignant neoplasm of breast: Secondary | ICD-10-CM | POA: Diagnosis not present

## 2019-04-11 ENCOUNTER — Other Ambulatory Visit: Payer: Self-pay

## 2019-04-11 DIAGNOSIS — Z20822 Contact with and (suspected) exposure to covid-19: Secondary | ICD-10-CM

## 2019-04-11 DIAGNOSIS — Z20828 Contact with and (suspected) exposure to other viral communicable diseases: Secondary | ICD-10-CM

## 2019-04-12 LAB — NOVEL CORONAVIRUS, NAA: SARS-CoV-2, NAA: NOT DETECTED

## 2019-06-06 DIAGNOSIS — R05 Cough: Secondary | ICD-10-CM | POA: Diagnosis not present

## 2019-06-27 ENCOUNTER — Other Ambulatory Visit: Payer: Self-pay

## 2020-04-19 ENCOUNTER — Ambulatory Visit: Payer: Self-pay | Admitting: Dermatology

## 2020-07-11 ENCOUNTER — Ambulatory Visit: Payer: Self-pay | Admitting: Dermatology

## 2020-07-26 ENCOUNTER — Other Ambulatory Visit: Payer: Self-pay

## 2020-07-26 ENCOUNTER — Ambulatory Visit: Payer: BC Managed Care – PPO | Admitting: Dermatology

## 2020-07-26 DIAGNOSIS — L578 Other skin changes due to chronic exposure to nonionizing radiation: Secondary | ICD-10-CM

## 2020-07-26 DIAGNOSIS — D2262 Melanocytic nevi of left upper limb, including shoulder: Secondary | ICD-10-CM | POA: Diagnosis not present

## 2020-07-26 DIAGNOSIS — Z1283 Encounter for screening for malignant neoplasm of skin: Secondary | ICD-10-CM

## 2020-07-26 DIAGNOSIS — D1800 Hemangioma unspecified site: Secondary | ICD-10-CM

## 2020-07-26 DIAGNOSIS — Z808 Family history of malignant neoplasm of other organs or systems: Secondary | ICD-10-CM

## 2020-07-26 DIAGNOSIS — D229 Melanocytic nevi, unspecified: Secondary | ICD-10-CM

## 2020-07-26 DIAGNOSIS — L821 Other seborrheic keratosis: Secondary | ICD-10-CM

## 2020-07-26 DIAGNOSIS — L814 Other melanin hyperpigmentation: Secondary | ICD-10-CM

## 2020-07-26 DIAGNOSIS — D369 Benign neoplasm, unspecified site: Secondary | ICD-10-CM

## 2020-07-26 NOTE — Patient Instructions (Addendum)
Melanoma ABCDEs  Melanoma is the most dangerous type of skin cancer, and is the leading cause of death from skin disease.  You are more likely to develop melanoma if you:  Have light-colored skin, light-colored eyes, or red or blond hair  Spend a lot of time in the sun  Tan regularly, either outdoors or in a tanning bed  Have had blistering sunburns, especially during childhood  Have a close family member who has had a melanoma  Have atypical moles or large birthmarks  Early detection of melanoma is key since treatment is typically straightforward and cure rates are extremely high if we catch it early.   The first sign of melanoma is often a change in a mole or a new dark spot.  The ABCDE system is a way of remembering the signs of melanoma.  A for asymmetry:  The two halves do not match. B for border:  The edges of the growth are irregular. C for color:  A mixture of colors are present instead of an even brown color. D for diameter:  Melanomas are usually (but not always) greater than 69mm - the size of a pencil eraser. E for evolution:  The spot keeps changing in size, shape, and color.  Please check your skin once per month between visits. You can use a small mirror in front and a large mirror behind you to keep an eye on the back side or your body.   If you see any new or changing lesions before your next follow-up, please call to schedule a visit.  Please continue daily skin protection including broad spectrum sunscreen SPF 30+ to sun-exposed areas, reapplying every 2 hours as needed when you're outdoors.   Recommend taking Heliocare sun protection supplement daily in sunny weather for additional sun protection. For maximum protection on the sunniest days, you can take up to 2 capsules of regular Heliocare OR take 1 capsule of Heliocare Ultra. For prolonged exposure (such as a full day in the sun), you can repeat your dose of the supplement 4 hours after your first dose. Heliocare  can be purchased at [facility] or at [email or web address].   Recommend taking Vitamin D supplement.

## 2020-07-26 NOTE — Progress Notes (Signed)
   Follow-Up Visit   Subjective  Brittney [name] is a 40 y.o. female who presents for the following: TBSE (Patient here for full body skin exam and skin cancer screening. No personal history of skin cancer but patient's father recently had a melanoma. She does have some spots at face she would like checked. ).  The following portions of the chart were reviewed this encounter and updated as appropriate:   Tobacco  Allergies  Meds  Problems  Med Hx  Surg Hx  Fam Hx      Review of Systems:  No other skin or systemic complaints except as noted in HPI or Assessment and Plan.  Objective  Well appearing patient in no apparent distress; mood and affect are within normal limits.  A full examination was performed including scalp, head, eyes, ears, nose, lips, neck, chest, axillae, abdomen, back, buttocks, bilateral upper extremities, bilateral lower extremities, hands, feet, fingers, toes, fingernails, and toenails. All findings within normal limits unless otherwise noted below.  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   Father had melanoma, maternal grandmother had endometrial cancer, mom and sister had breast cancer, paternal grandmother had bile duct cancer.   Reviewed CMP - Calcium normal.  Personal and family history not suggestive of MEN1 syndrome at this time.  Discussed treating with biopsy and cautery if bothersome, patient advised they do come back and need to be treated more than once.  RTC if any changes to re-evaluate.   Nevus Left lateral antecubital fossa  Benign-appearing.  Observation.  Call clinic for new or changing lesions.  Recommend daily use of broad spectrum spf 30+ sunscreen to sun-exposed areas.     Lentigines - Scattered tan macules - Discussed due to sun exposure -  Benign, observe - Call for any changes  Seborrheic Keratoses - Stuck-on, waxy, tan-brown papules and plaques  - Discussed benign etiology and prognosis. - Observe - Call for any changes  Melanocytic Nevi - Tan-brown and/or pink-flesh-colored symmetric macules and papules - Benign appearing on exam today - Observation - Call clinic for new or changing moles - Recommend daily use of broad spectrum spf 30+ sunscreen to sun-exposed areas.   Hemangiomas - Red papules - Discussed benign nature - Observe - Call for any changes  Actinic Damage - Chronic, secondary to cumulative UV/sun exposure - diffuse scaly erythematous macules with underlying dyspigmentation - Recommend daily broad spectrum sunscreen SPF 30+ to sun-exposed areas, reapply every 2 hours as needed.  - Call for new or changing lesions.  Skin cancer screening performed today.   Return in about 1 year (around 07/26/2021) for TBSE.  Graciella Belton, RMA, am acting as scribe for Forest Gleason, MD .  Documentation: I have reviewed the above documentation for accuracy and completeness, and I agree with the above.  Forest Gleason, MD

## 2020-07-27 ENCOUNTER — Encounter: Payer: Self-pay | Admitting: Dermatology

## 2020-08-10 ENCOUNTER — Encounter: Payer: Self-pay | Admitting: Dermatology

## 2021-01-30 DIAGNOSIS — Z1231 Encounter for screening mammogram for malignant neoplasm of breast: Secondary | ICD-10-CM | POA: Diagnosis not present

## 2021-08-01 ENCOUNTER — Encounter: Payer: Self-pay | Admitting: Dermatology

## 2021-08-01 ENCOUNTER — Other Ambulatory Visit: Payer: Self-pay

## 2021-08-01 ENCOUNTER — Ambulatory Visit: Payer: 59 | Admitting: Dermatology

## 2021-08-01 DIAGNOSIS — Z1283 Encounter for screening for malignant neoplasm of skin: Secondary | ICD-10-CM | POA: Diagnosis not present

## 2021-08-01 DIAGNOSIS — L814 Other melanin hyperpigmentation: Secondary | ICD-10-CM | POA: Diagnosis not present

## 2021-08-01 DIAGNOSIS — D492 Neoplasm of unspecified behavior of bone, soft tissue, and skin: Secondary | ICD-10-CM

## 2021-08-01 DIAGNOSIS — L821 Other seborrheic keratosis: Secondary | ICD-10-CM

## 2021-08-01 DIAGNOSIS — K130 Diseases of lips: Secondary | ICD-10-CM

## 2021-08-01 DIAGNOSIS — D2239 Melanocytic nevi of other parts of face: Secondary | ICD-10-CM | POA: Diagnosis not present

## 2021-08-01 DIAGNOSIS — D1800 Hemangioma unspecified site: Secondary | ICD-10-CM

## 2021-08-01 DIAGNOSIS — D229 Melanocytic nevi, unspecified: Secondary | ICD-10-CM

## 2021-08-01 DIAGNOSIS — Z808 Family history of malignant neoplasm of other organs or systems: Secondary | ICD-10-CM

## 2021-08-01 MED ORDER — HYDROCORTISONE 2.5 % EX CREA: TOPICAL_CREAM | CUTANEOUS | 2 refills | Status: AC

## 2021-08-01 MED ORDER — KETOCONAZOLE 2 % EX CREA: 1.0000 "application " | TOPICAL_CREAM | Freq: Two times a day (BID) | CUTANEOUS | 2 refills | Status: AC

## 2021-08-01 NOTE — Patient Instructions (Addendum)
Recommend taking Heliocare sun protection supplement daily in sunny weather for additional sun protection. For maximum protection on the sunniest days, you can take up to 2 capsules of regular Heliocare OR take 1 capsule of Heliocare Ultra. For prolonged exposure (such as a full day in the sun), you can repeat your dose of the supplement 4 hours after your first dose. Heliocare can be purchased at [employer], at some [facility] or at [email or web address].    Ketoconazole 2% cream twice daily as needed  Hydrocortisone 2.5%  cream twice a day as needed up to 2 weeks  Vaseline Jelly or Aquaphor only for lip balm  Topical steroids (such as triamcinolone, fluocinolone, fluocinonide, mometasone, clobetasol, halobetasol, betamethasone, hydrocortisone) can cause thinning and lightening of the skin if they are used for too long in the same area. Your physician has selected the right strength medicine for your problem and area affected on the body. Please use your medication only as directed by your physician to prevent side effects.    Some Recommended Sunscreens Include:  Good for Daily Wear (feels like lotion but NOT sweat resistant) Cerave AM Moisturizer with SPF EltaMD UV Lotion  Body or All Over Sunscreen EltaMD UV active for body and face Blue lizard sensitive Sun bum mineral (avoid if sensitive to scent) Aveeno Positively Mineral Neutrogena sheer zinc (Slightly harder to rub in) [facility] clear zinc (Slightly harder to rub in)  Clear Face Sunscreen EltaMD UV Elements CeraVe hydrating sunscreen 50 face  Tinted Face Sunscreen Alastin Hydratint (good for most skin tones, may be slightly dark if you are very fair) Colorescience Sunforgettable Total Protection Face Shield (good for most skin tones) EltaMD UV Physical La Roche Posay Mineral Tinted Cotz Flawless Complexion   Powder Sunscreen (Nice for reapplying or applying on the go) Colorescience Sunforgettable Total Protection Brush on  Shield (available in different tints)  Face Sunscreen Available in Different Tints Colorescience Sunforgettable Total Protection Brush on Shield  bareMinerals Complexion Rescue Tinted Hydrating Gel Cream Broad Spectrum SPF 30 UnSun mineral tinted (comes in medium/dark and light/medium)  Kids (over 6 months) - Mineral Sunscreens Recommended eltaMD UV Pure MDsolarSciences KidStick 40 SPF Aveeno Baby Continuous Protection Sensitive Zinc Oxide Blue Lizard Kids mineral based sunscreen lotion Mustela Mineral Sunscreen for face and body Neutrogena Sheer Zinc Kids Sunscreen Stick  Tinted to look like a tan PCAskin sheer tint body spray   Wound Care Instructions  Cleanse wound gently with soap and water once a day then pat dry with clean gauze. Apply a thing coat of Petrolatum (petroleum jelly, "Vaseline") over the wound (unless you have an allergy to this). We recommend that you use a new, sterile tube of Vaseline. Do not pick or remove scabs. Do not remove the yellow or white "healing tissue" from the base of the wound.  Cover the wound with fresh, clean, nonstick gauze and secure with paper tape. You may use Band-Aids in place of gauze and tape if the would is small enough, but would recommend trimming much of the tape off as there is often too much. Sometimes Band-Aids can irritate the skin.  You should call the office for your biopsy report after 1 week if you have not already been contacted.  If you experience any problems, such as abnormal amounts of bleeding, swelling, significant bruising, significant pain, or evidence of infection, please call the office immediately.  FOR ADULT SURGERY PATIENTS: If you need something for pain relief you may take 1 extra strength Tylenol (acetaminophen)  AND 2 Ibuprofen (200mg  each) together every 4 hours as needed for pain. (do not take these if you are allergic to them or if you have a reason you should not take them.) Typically, you may only need pain  medication for 1 to 3 days.  If You Need Anything After Your Visit  If you have any questions or concerns for your doctor, please call our main line at [phone] and press option 4 to reach your doctor's medical assistant. If no one answers, please leave a voicemail as directed and we will return your call as soon as possible. Messages left after 4 pm will be answered the following business day.   You may also send [place] a message via [place]. We typically respond to MyChart messages within 1-2 business days.  For prescription refills, please ask your pharmacy to contact our office. Our fax number is [phone].  If you have an urgent issue when the clinic is closed that cannot wait until the next business day, you can page your doctor at the number below.    Please note that while we do our best to be available for urgent issues outside of office hours, we are not available 24/7.   If you have an urgent issue and are unable to reach [place], you may choose to seek medical care at your doctor's office, retail clinic, urgent care center, or emergency room.  If you have a medical emergency, please immediately call 911 or go to the emergency department.  Pager Numbers  - Dr. Nehemiah Massed: [phone]  - Dr. Laurence Ferrari: [phone]  - Dr. Nicole Kindred: [phone]  In the event of inclement weather, please call our main line at [phone] for an update on the status of any delays or closures.  Dermatology Medication Tips: Please keep the boxes that topical medications come in in order to help keep track of the instructions about where and how to use these. Pharmacies typically print the medication instructions only on the boxes and not directly on the medication tubes.   If your medication is too expensive, please contact our office at [phone] option 4 or send [place] a message through [place].   We are unable to tell what your co-pay for medications will be in advance as this is different depending  on your insurance coverage. However, we may be able to find a substitute medication at lower cost or fill out paperwork to get insurance to cover a needed medication.   If a prior authorization is required to get your medication covered by your insurance company, please allow [place] 1-2 business days to complete this process.  Drug prices often vary depending on where the prescription is filled and some pharmacies may offer cheaper prices.  The website www.goodrx.com contains coupons for medications through different pharmacies. The prices here do not account for what the cost may be with help from insurance (it may be cheaper with your insurance), but the website can give you the price if you did not use any insurance.  - You can print the associated coupon and take it with your prescription to the pharmacy.  - You may also stop by our office during regular business hours and pick up a GoodRx coupon card.  - If you need your prescription sent electronically to a different pharmacy, notify our office through [facility] or by phone at [phone] option 4.     Si Usted Necesita Algo Despus de Su Visita  Tambin puede enviarnos un mensaje a Lawerance Cruel  de MyChart. Por lo general respondemos a los mensajes de MyChart en el transcurso de 1 a 2 das hbiles.  Para renovar recetas, por favor pida a su farmacia que se ponga en contacto con nuestra oficina. Harland Dingwall de fax es [place] [phone].  Si tiene un asunto urgente cuando la clnica est cerrada y que no puede esperar hasta el siguiente da hbil, puede llamar/localizar a su doctor(a) al nmero que aparece a continuacin.   Por favor, tenga en cuenta que aunque hacemos todo lo posible para estar disponibles para asuntos urgentes fuera del horario de [place], no estamos disponibles las 24 horas del da, los 7 das de la [place].   Si tiene un problema urgente y no puede comunicarse con nosotros, puede optar por buscar atencin mdica  en  el consultorio de su doctor(a), en una clnica privada, en un centro de atencin urgente o en una sala de emergencias.  Si tiene Engineering geologist, por favor llame inmediatamente al 911 o vaya a la sala de emergencias.  Nmeros de bper  - Dr. Nehemiah Massed: [phone]  - Dra. Moye: [phone]  - Dra. Nicole Kindred: [phone]  En caso de inclemencias del [place], por favor llame a Johnsie Kindred principal al [phone] para una actualizacin sobre el [place] de cualquier retraso o cierre.  Consejos para la medicacin en dermatologa: Por favor, guarde las cajas en las que vienen los medicamentos de uso tpico para ayudarle a seguir las instrucciones sobre dnde y cmo usarlos. Las farmacias generalmente imprimen las instrucciones del medicamento slo en las cajas y no directamente en los tubos del [place].   Si su medicamento es muy caro, por favor, pngase en contacto con Zigmund Daniel llamando al [phone] y presione la opcin 4 o envenos un mensaje a travs de Pharmacist, community.   No podemos decirle cul ser su copago por los medicamentos por adelantado ya que esto es diferente dependiendo de la cobertura de su seguro. Sin embargo, es posible que podamos encontrar un medicamento sustituto a Electrical engineer un formulario para que el seguro cubra el medicamento que se considera necesario.   Si se requiere una autorizacin previa para que su compaa de seguros [place] su medicamento, por favor permtanos de 1 a 2 das hbiles para completar este proceso.  Los precios de los medicamentos varan con frecuencia dependiendo del Environmental consultant de dnde se surte la receta y alguna farmacias pueden ofrecer precios ms baratos.  El sitio web www.goodrx.com tiene cupones para medicamentos de Airline pilot. Los precios aqu no tienen en cuenta lo que podra costar con la ayuda del seguro (puede ser ms barato con su seguro), pero el sitio web puede darle el precio si no utiliz Research scientist (physical sciences).  -  Puede imprimir el cupn correspondiente y llevarlo con su receta a la farmacia.  - Tambin puede pasar por nuestra oficina durante el horario de atencin regular y Charity fundraiser una tarjeta de cupones de GoodRx.  - Si necesita que su receta se enve electrnicamente a una farmacia diferente, informe a nuestra oficina a travs de MyChart de [place] o por telfono llamando al [phone] y presione la opcin 4.

## 2021-08-01 NOTE — Progress Notes (Signed)
Follow-Up Visit   Subjective  Brittney [name] is a 41 y.o. female who presents for the following: Annual Exam (Patient here for full body skin exam and skin cancer screening. No personal history of skin cancer but patient's father has h/o melanoma. 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  She is also bothered by chronically dry lips for the last year.   The patient presents for Total-Body Skin Exam (TBSE) for skin cancer screening and mole check.  The patient has spots, moles and lesions to be evaluated, some may be new or changing and the patient has concerns that these could be cancer.   The following portions of the chart were reviewed this encounter and updated as appropriate:  Tobacco   Allergies   Meds   Problems   Med Hx   Surg Hx   Fam Hx       Review of Systems: No other skin or systemic complaints except as noted in HPI or Assessment and Plan.   Objective  Well appearing patient in no apparent distress; mood and affect are within normal limits.  A full examination was performed including scalp, head, eyes, ears, nose, lips, neck, chest, axillae, abdomen, back, buttocks, bilateral upper extremities, bilateral lower extremities, hands, feet, fingers, toes, fingernails, and toenails. All findings within normal limits unless otherwise noted below.  Left Ala Nasi 0.35cm erythematous pink papule     Mid Upper Vermilion Lip Xerosis and mile scale of upper and lower lips   Assessment & Plan   Lentigines - Scattered tan macules - Due to sun exposure - Benign-appearing, observe - Recommend daily broad spectrum sunscreen SPF 30+ to sun-exposed areas, reapply every 2 hours as needed. - Call for any changes  Seborrheic Keratoses - Stuck-on, waxy, tan-brown papules and/or plaques  - Benign-appearing - Discussed benign etiology and prognosis. - Observe - Call for any changes  Melanocytic Nevi - Tan-brown and/or pink-flesh-colored symmetric  macules and papules - Benign appearing on exam today - Observation - Call clinic for new or changing moles - Recommend daily use of broad spectrum spf 30+ sunscreen to sun-exposed areas.   Hemangiomas - Red papules - Discussed benign nature - Observe - Call for any changes   Skin cancer screening performed today.  Neoplasm of skin Left Ala Nasi  Epidermal / dermal shaving  Lesion diameter (cm):  0.4 Informed consent: discussed and consent obtained   Patient was prepped and draped in usual sterile fashion: Area prepped with alcohol. 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 Condition is bothersome to patient. Currently flared.  Start Ketoconazole 2% cream twice daily as needed  Start Hydrocortisone 2.5%  cream twice a day as needed up to 2 weeks  D/c other lip balms and use only Vaseline Jelly or Aquaphor only for lip balm  If doing well and off of medications, can try restarting  other lip balms one at a time  ketoconazole (NIZORAL) 2 % cream - Mid Upper Vermilion Lip Apply 1 application topically 2 (two) times daily. To lips for up to 4 weeks for dryness  hydrocortisone 2.5 % cream - Mid Upper Vermilion Lip Apply twice daily to lips as needed up to 2 weeks   Return in about 1 year  (around 08/01/2022) for TBSE.  I, Emelia Salisbury, CMA, am acting as scribe for Forest Gleason, MD.  Documentation: I have reviewed the above documentation for accuracy and completeness, and I agree with the above.  Forest Gleason, MD

## 2021-08-02 ENCOUNTER — Encounter: Payer: Self-pay | Admitting: Dermatology

## 2021-08-06 ENCOUNTER — Telehealth: Payer: Self-pay

## 2021-08-06 NOTE — Telephone Encounter (Signed)
-----   Message from [place], MD sent at 08/05/2021 11:09 PM EST ----- Skin , left ala nasi FIBROUS PAPULE Brittney [name] "benign growth", no additional treatment needed  MAs please call. Thank you!

## 2021-08-06 NOTE — Telephone Encounter (Signed)
Patient advised of BX result. aw 

## 2021-08-06 NOTE — Telephone Encounter (Signed)
Called patient to discuss pathology results. N/A. LMOVM to C/B. JP

## 2021-11-14 ENCOUNTER — Encounter: Payer: Self-pay | Admitting: Dermatology

## 2021-11-21 ENCOUNTER — Ambulatory Visit: Payer: 59 | Admitting: Dermatology

## 2021-11-21 DIAGNOSIS — L578 Other skin changes due to chronic exposure to nonionizing radiation: Secondary | ICD-10-CM | POA: Diagnosis not present

## 2021-11-21 DIAGNOSIS — L821 Other seborrheic keratosis: Secondary | ICD-10-CM | POA: Diagnosis not present

## 2021-11-21 DIAGNOSIS — L820 Inflamed seborrheic keratosis: Secondary | ICD-10-CM | POA: Diagnosis not present

## 2021-11-21 NOTE — Progress Notes (Signed)
   Follow-Up Visit   Subjective  Brittney [name] is a 41 y.o. female who presents for the following: Follow-up (Patient states bump came up at left post shoulder in may 11, has scabbed over and crusted. Patient reports family h/o of melanoma. ). The patient has spots, moles and lesions to be evaluated, some may be new or changing and the patient has concerns that these could be cancer.  The following portions of the chart were reviewed this encounter and updated as appropriate:  Tobacco  Allergies  Meds  Problems  Med Hx  Surg Hx  Fam Hx     Review of Systems: No other skin or systemic complaints except as noted in HPI or Assessment and Plan.  Objective  Well appearing patient in no apparent distress; mood and affect are within normal limits.  A focused examination was performed including left shoulder . Relevant physical exam findings are noted in the Assessment and Plan.  left posterior shoulder x 1 Erythematous stuck-on, waxy papule or plaque   Assessment & Plan  Inflamed seborrheic keratosis left posterior shoulder x 1 Symptomatic, irritating, patient would like treated.  Destruction of lesion - left posterior shoulder x 1 Complexity: simple   Destruction method: cryotherapy   Informed consent: discussed and consent obtained   Timeout:  patient name, date of birth, surgical site, and procedure verified Lesion destroyed using liquid nitrogen: Yes   Region frozen until ice ball extended beyond lesion: Yes   Outcome: patient tolerated procedure well with no complications   Post-procedure details: wound care instructions given   Additional details:  Prior to procedure, discussed risks of blister formation, small wound, skin dyspigmentation, or rare scar following cryotherapy. Recommend Vaseline ointment to treated areas while healing.  Seborrheic Keratoses - Stuck-on, waxy, tan-brown papules and/or plaques  - Benign-appearing - Discussed benign etiology and prognosis. -  Observe - Call for any changes  Actinic Damage - chronic, secondary to cumulative UV radiation exposure/sun exposure over time - diffuse scaly erythematous macules with underlying dyspigmentation - Recommend daily broad spectrum sunscreen SPF 30+ to sun-exposed areas, reapply every 2 hours as needed.  - Recommend staying in the shade or wearing long sleeves, sun glasses (UVA+UVB protection) and wide brim hats (4-inch brim around the entire circumference of the hat). - Call for new or changing lesions.  Return if symptoms worsen or fail to improve, for keep follow up as schedule 08/07/2022. IRuthell Rummage, CMA, am acting as scribe for Sarina Ser, MD. Documentation: I have reviewed the above documentation for accuracy and completeness, and I agree with the above.  Sarina Ser, MD

## 2021-11-21 NOTE — Patient Instructions (Addendum)
Cryotherapy Aftercare  Wash gently with soap and water everyday.   Apply Vaseline and Band-Aid daily until healed.    Seborrheic Keratosis  What causes seborrheic keratoses? Seborrheic keratoses are harmless, common skin growths that first appear during adult life.  As time goes by, more growths appear.  Some people may develop a large number of them.  Seborrheic keratoses appear on both covered and uncovered body parts.  They are not caused by sunlight.  The tendency to develop seborrheic keratoses can be inherited.  They vary in color from skin-colored to gray, brown, or even black.  They can be either smooth or have a rough, warty surface.   Seborrheic keratoses are superficial and look as if they were stuck on the skin.  Under the microscope this type of keratosis looks like layers upon layers of skin.  That is why at times the top layer may seem to fall off, but the rest of the growth remains and re-grows.    Treatment Seborrheic keratoses do not need to be treated, but can easily be removed in the office.  Seborrheic keratoses often cause symptoms when they rub on clothing or jewelry.  Lesions can be in the way of shaving.  If they become inflamed, they can cause itching, soreness, or burning.  Removal of a seborrheic keratosis can be accomplished by freezing, burning, or surgery. If any spot bleeds, scabs, or grows rapidly, please return to have it checked, as these can be an indication of a skin cancer.       If You Need Anything After Your Visit  If you have any questions or concerns for your doctor, please call our main line at [phone] and press option 4 to reach your doctor's medical assistant. If no one answers, please leave a voicemail as directed and we will return your call as soon as possible. Messages left after 4 pm will be answered the following business day.   You may also send [place] a message via [place]. We typically respond to MyChart messages within 1-2 business  days.  For prescription refills, please ask your pharmacy to contact our office. Our fax number is [phone].  If you have an urgent issue when the clinic is closed that cannot wait until the next business day, you can page your doctor at the number below.    Please note that while we do our best to be available for urgent issues outside of office hours, we are not available 24/7.   If you have an urgent issue and are unable to reach [place], you may choose to seek medical care at your doctor's office, retail clinic, urgent care center, or emergency room.  If you have a medical emergency, please immediately call 911 or go to the emergency department.  Pager Numbers  - Dr. Nehemiah Massed: [phone]  - Dr. Laurence Ferrari: [phone]  - Dr. Nicole Kindred: [phone]  In the event of inclement weather, please call our main line at [phone] for an update on the status of any delays or closures.  Dermatology Medication Tips: Please keep the boxes that topical medications come in in order to help keep track of the instructions about where and how to use these. Pharmacies typically print the medication instructions only on the boxes and not directly on the medication tubes.   If your medication is too expensive, please contact our office at [phone] option 4 or send [place] a message through [place].   We are unable to tell what your co-pay for  medications will be in advance as this is different depending on your insurance coverage. However, we may be able to find a substitute medication at lower cost or fill out paperwork to get insurance to cover a needed medication.   If a prior authorization is required to get your medication covered by your insurance company, please allow [place] 1-2 business days to complete this process.  Drug prices often vary depending on where the prescription is filled and some pharmacies may offer cheaper prices.  The website www.goodrx.com contains coupons for medications through  different pharmacies. The prices here do not account for what the cost may be with help from insurance (it may be cheaper with your insurance), but the website can give you the price if you did not use any insurance.  - You can print the associated coupon and take it with your prescription to the pharmacy.  - You may also stop by our office during regular business hours and pick up a GoodRx coupon card.  - If you need your prescription sent electronically to a different pharmacy, notify our office through [facility] or by phone at [phone] option 4.     Si Usted Necesita Algo Despus de Su Visita  Tambin puede enviarnos un mensaje a travs de Pharmacist, community. Por lo general respondemos a los mensajes de MyChart en el transcurso de 1 a 2 das hbiles.  Para renovar recetas, por favor pida a su farmacia que se ponga en contacto con nuestra oficina. Harland Dingwall de fax es [place] [phone].  Si tiene un asunto urgente cuando la clnica est cerrada y que no puede esperar hasta el siguiente da hbil, puede llamar/localizar a su doctor(a) al nmero que aparece a continuacin.   Por favor, tenga en cuenta que aunque hacemos todo lo posible para estar disponibles para asuntos urgentes fuera del horario de [place], no estamos disponibles las 24 horas del da, los 7 das de la [place].   Si tiene un problema urgente y no puede comunicarse con nosotros, puede optar por buscar atencin mdica  en el consultorio de su doctor(a), en una clnica privada, en un centro de atencin urgente o en una sala de emergencias.  Si tiene Engineering geologist, por favor llame inmediatamente al 911 o vaya a la sala de emergencias.  Nmeros de bper  - Dr. Nehemiah Massed: [phone]  - Dra. Moye: [phone]  - Dra. Nicole Kindred: [phone]  En caso de inclemencias del [place], por favor llame a Johnsie Kindred principal al [phone] para una actualizacin sobre el [place] de cualquier retraso o cierre.  Consejos  para la medicacin en dermatologa: Por favor, guarde las cajas en las que vienen los medicamentos de uso tpico para ayudarle a seguir las instrucciones sobre dnde y cmo usarlos. Las farmacias generalmente imprimen las instrucciones del medicamento slo en las cajas y no directamente en los tubos del [place].   Si su medicamento es muy caro, por favor, pngase en contacto con Zigmund Daniel llamando al [phone] y presione la opcin 4 o envenos un mensaje a travs de Pharmacist, community.   No podemos decirle cul ser su copago por los medicamentos por adelantado ya que esto es diferente dependiendo de la cobertura de su seguro. Sin embargo, es posible que podamos encontrar un medicamento sustituto a Electrical engineer un formulario para que el seguro cubra el medicamento que se considera necesario.   Si se requiere una autorizacin previa para que su compaa de seguros [place] su medicamento, por favor permtanos  de 1 a 2 das hbiles para completar este proceso.  Los precios de los medicamentos varan con frecuencia dependiendo del Environmental consultant de dnde se surte la receta y alguna farmacias pueden ofrecer precios ms baratos.  El sitio web www.goodrx.com tiene cupones para medicamentos de Airline pilot. Los precios aqu no tienen en cuenta lo que podra costar con la ayuda del seguro (puede ser ms barato con su seguro), pero el sitio web puede darle el precio si no utiliz Research scientist (physical sciences).  - Puede imprimir el cupn correspondiente y llevarlo con su receta a la farmacia.  - Tambin puede pasar por nuestra oficina durante el horario de atencin regular y Charity fundraiser una tarjeta de cupones de GoodRx.  - Si necesita que su receta se enve electrnicamente a una farmacia diferente, informe a nuestra oficina a travs de MyChart de [place] o por telfono llamando al [phone] y presione la opcin 4.

## 2021-12-01 ENCOUNTER — Encounter: Payer: Self-pay | Admitting: Dermatology

## 2022-01-06 ENCOUNTER — Encounter: Payer: Self-pay | Admitting: Dermatology

## 2022-01-08 NOTE — Telephone Encounter (Signed)
Please advise patient I cannot tell what this is based on this photo. It could possibly be an inflamed SK or could be an infection or something else. I would recommend in-person evaluation and we can treat since it is symptomatic. I can work her in today or tomorrow.

## 2022-01-09 ENCOUNTER — Ambulatory Visit: Payer: 59 | Admitting: Dermatology

## 2022-01-09 DIAGNOSIS — R21 Rash and other nonspecific skin eruption: Secondary | ICD-10-CM | POA: Diagnosis not present

## 2022-01-09 NOTE — Patient Instructions (Addendum)
Start fluticasone twice daily for 2-3 days then switch to [facility] once calmed down as needed for rash/itch. Avoid applying to face, groin, and axilla. Use as directed. Long-term use can cause thinning of the skin.  Topical steroids (such as triamcinolone, fluocinolone, fluocinonide, mometasone, clobetasol, halobetasol, betamethasone, hydrocortisone) can cause thinning and lightening of the skin if they are used for too long in the same area. Your physician has selected the right strength medicine for your problem and area affected on the body. Please use your medication only as directed by your physician to prevent side effects.   Recommend taking Heliocare sun protection supplement daily in sunny weather for additional sun protection. For maximum protection on the sunniest days, you can take up to 2 capsules of regular Heliocare OR take 1 capsule of Heliocare Ultra. For prolonged exposure (such as a full day in the sun), you can repeat your dose of the supplement 4 hours after your first dose. Heliocare can be purchased at [employer], at some [facility] or at [email or web address].    Due to recent changes in healthcare laws, you may see results of your pathology and/or laboratory studies on MyChart before the doctors have had a chance to review them. We understand that in some cases there may be results that are confusing or concerning to you. Please understand that not all results are received at the same time and often the doctors may need to interpret multiple results in order to provide you with the best plan of care or course of treatment. Therefore, we ask that you please give [place] 2 business days to thoroughly review all your results before contacting the office for clarification. Should we see a critical lab result, you will be contacted sooner.   If You Need Anything After Your Visit  If you have any questions or concerns for your doctor, please call our main line at [phone] and press  option 4 to reach your doctor's medical assistant. If no one answers, please leave a voicemail as directed and we will return your call as soon as possible. Messages left after 4 pm will be answered the following business day.   You may also send [place] a message via [place]. We typically respond to MyChart messages within 1-2 business days.  For prescription refills, please ask your pharmacy to contact our office. Our fax number is [phone].  If you have an urgent issue when the clinic is closed that cannot wait until the next business day, you can page your doctor at the number below.    Please note that while we do our best to be available for urgent issues outside of office hours, we are not available 24/7.   If you have an urgent issue and are unable to reach [place], you may choose to seek medical care at your doctor's office, retail clinic, urgent care center, or emergency room.  If you have a medical emergency, please immediately call 911 or go to the emergency department.  Pager Numbers  - Dr. Nehemiah Massed: [phone]  - Dr. Laurence Ferrari: [phone]  - Dr. Nicole Kindred: [phone]  In the event of inclement weather, please call our main line at [phone] for an update on the status of any delays or closures.  Dermatology Medication Tips: Please keep the boxes that topical medications come in in order to help keep track of the instructions about where and how to use these. Pharmacies typically print the medication instructions only on the boxes and not directly on the medication  tubes.   If your medication is too expensive, please contact our office at [phone] option 4 or send [place] a message through [place].   We are unable to tell what your co-pay for medications will be in advance as this is different depending on your insurance coverage. However, we may be able to find a substitute medication at lower cost or fill out paperwork to get insurance to cover a needed medication.   If a  prior authorization is required to get your medication covered by your insurance company, please allow [place] 1-2 business days to complete this process.  Drug prices often vary depending on where the prescription is filled and some pharmacies may offer cheaper prices.  The website www.goodrx.com contains coupons for medications through different pharmacies. The prices here do not account for what the cost may be with help from insurance (it may be cheaper with your insurance), but the website can give you the price if you did not use any insurance.  - You can print the associated coupon and take it with your prescription to the pharmacy.  - You may also stop by our office during regular business hours and pick up a GoodRx coupon card.  - If you need your prescription sent electronically to a different pharmacy, notify our office through [facility] or by phone at [phone] option 4.     Si Usted Necesita Algo Despus de Su Visita  Tambin puede enviarnos un mensaje a travs de Pharmacist, community. Por lo general respondemos a los mensajes de MyChart en el transcurso de 1 a 2 das hbiles.  Para renovar recetas, por favor pida a su farmacia que se ponga en contacto con nuestra oficina. Harland Dingwall de fax es [place] [phone].  Si tiene un asunto urgente cuando la clnica est cerrada y que no puede esperar hasta el siguiente da hbil, puede llamar/localizar a su doctor(a) al nmero que aparece a continuacin.   Por favor, tenga en cuenta que aunque hacemos todo lo posible para estar disponibles para asuntos urgentes fuera del horario de [place], no estamos disponibles las 24 horas del da, los 7 das de la [place].   Si tiene un problema urgente y no puede comunicarse con nosotros, puede optar por buscar atencin mdica  en el consultorio de su doctor(a), en una clnica privada, en un centro de atencin urgente o en una sala de emergencias.  Si tiene Engineering geologist, por favor llame  inmediatamente al 911 o vaya a la sala de emergencias.  Nmeros de bper  - Dr. Nehemiah Massed: [phone]  - Dra. Moye: [phone]  - Dra. Nicole Kindred: [phone]  En caso de inclemencias del [place], por favor llame a Johnsie Kindred principal al [phone] para una actualizacin sobre el [place] de cualquier retraso o cierre.  Consejos para la medicacin en dermatologa: Por favor, guarde las cajas en las que vienen los medicamentos de uso tpico para ayudarle a seguir las instrucciones sobre dnde y cmo usarlos. Las farmacias generalmente imprimen las instrucciones del medicamento slo en las cajas y no directamente en los tubos del [place].   Si su medicamento es muy caro, por favor, pngase en contacto con Zigmund Daniel llamando al [phone] y presione la opcin 4 o envenos un mensaje a travs de Pharmacist, community.   No podemos decirle cul ser su copago por los medicamentos por adelantado ya que esto es diferente dependiendo de la cobertura de su seguro. Sin embargo, es posible que podamos encontrar un medicamento sustituto a Scientist, water quality  o llenar un formulario para que el seguro cubra el medicamento que se considera necesario.   Si se requiere una autorizacin previa para que su compaa de seguros [place] su medicamento, por favor permtanos de 1 a 2 das hbiles para completar este proceso.  Los precios de los medicamentos varan con frecuencia dependiendo del Environmental consultant de dnde se surte la receta y alguna farmacias pueden ofrecer precios ms baratos.  El sitio web www.goodrx.com tiene cupones para medicamentos de Airline pilot. Los precios aqu no tienen en cuenta lo que podra costar con la ayuda del seguro (puede ser ms barato con su seguro), pero el sitio web puede darle el precio si no utiliz Research scientist (physical sciences).  - Puede imprimir el cupn correspondiente y llevarlo con su receta a la farmacia.  - Tambin puede pasar por nuestra oficina durante el horario de atencin regular y Charity fundraiser  una tarjeta de cupones de GoodRx.  - Si necesita que su receta se enve electrnicamente a una farmacia diferente, informe a nuestra oficina a travs de MyChart de [place] o por telfono llamando al [phone] y presione la opcin 4.

## 2022-01-09 NOTE — Progress Notes (Signed)
   Follow-Up Visit   Subjective  Brittney [name] is a 41 y.o. female who presents for the following: Skin Problem (Patient here today for an itchy spot that came up about 1 week ago at right low neck. ).  The following portions of the chart were reviewed this encounter and updated as appropriate:   Tobacco  Allergies  Meds  Problems  Med Hx  Surg Hx  Fam Hx      Review of Systems:  No other skin or systemic complaints except as noted in HPI or Assessment and Plan.  Objective  Well appearing patient in no apparent distress; mood and affect are within normal limits.  A focused examination was performed including neck, shoulders. Relevant physical exam findings are noted in the Assessment and Plan.  right neck Slightly scaly small pink erythematous plaque with tan patch at sup periphery without features suspicious for malignancy on dermoscopy     Assessment & Plan  Rash right neck  DDX ISK, Lichenoid keratosis or dermatitis  Benign-appearing on exam  Start fluticasone twice daily for 2-3 days then switch to [facility] once calmed down as needed for rash/itch. Patient has at home. Avoid applying to face, groin, and axilla. Use as directed. Long-term use can cause thinning of the skin.  Topical steroids (such as triamcinolone, fluocinolone, fluocinonide, mometasone, clobetasol, halobetasol, betamethasone, hydrocortisone) can cause thinning and lightening of the skin if they are used for too long in the same area. Your physician has selected the right strength medicine for your problem and area affected on the body. Please use your medication only as directed by your physician to prevent side effects.     Return for as scheduled.  Graciella Belton, RMA, am acting as scribe for Forest Gleason, MD .  Documentation: I have reviewed the above documentation for accuracy and completeness, and I agree with the above.  Forest Gleason, MD

## 2022-01-20 ENCOUNTER — Encounter: Payer: Self-pay | Admitting: Dermatology

## 2022-04-14 ENCOUNTER — Encounter: Payer: Self-pay | Admitting: Dermatology

## 2022-08-07 ENCOUNTER — Ambulatory Visit: Payer: 59 | Admitting: Dermatology

## 2022-08-07 ENCOUNTER — Encounter: Payer: Self-pay | Admitting: Dermatology

## 2022-08-07 VITALS — BP 129/73 | HR 81

## 2022-08-07 DIAGNOSIS — L814 Other melanin hyperpigmentation: Secondary | ICD-10-CM

## 2022-08-07 DIAGNOSIS — Z1283 Encounter for screening for malignant neoplasm of skin: Secondary | ICD-10-CM | POA: Diagnosis not present

## 2022-08-07 DIAGNOSIS — Z808 Family history of malignant neoplasm of other organs or systems: Secondary | ICD-10-CM | POA: Diagnosis not present

## 2022-08-07 DIAGNOSIS — L578 Other skin changes due to chronic exposure to nonionizing radiation: Secondary | ICD-10-CM | POA: Diagnosis not present

## 2022-08-07 DIAGNOSIS — D229 Melanocytic nevi, unspecified: Secondary | ICD-10-CM

## 2022-08-07 DIAGNOSIS — L821 Other seborrheic keratosis: Secondary | ICD-10-CM

## 2022-08-07 NOTE — Progress Notes (Signed)
   Follow-Up Visit   Subjective  Brittney [name] is a 42 y.o. female who presents for the following: Annual Exam (No personal Hx of skin cancer or dysplastic nevi. Father with Hx of MM. No family Hx of pancreatic cancer).  The patient presents for Total-Body Skin Exam (TBSE) for skin cancer screening and mole check.  The patient has spots, moles and lesions to be evaluated, some may be new or changing and the patient has concerns that these could be cancer.   The following portions of the chart were reviewed this encounter and updated as appropriate:  Tobacco  Allergies  Meds  Problems  Med Hx  Surg Hx  Fam Hx      Review of Systems: No other skin or systemic complaints except as noted in HPI or Assessment and Plan.   Objective  Well appearing patient in no apparent distress; mood and affect are within normal limits.  A full examination was performed including scalp, head, eyes, ears, nose, lips, neck, chest, axillae, abdomen, back, buttocks, bilateral upper extremities, bilateral lower extremities, hands, feet, fingers, toes, fingernails, and toenails. All findings within normal limits unless otherwise noted below.   Assessment & Plan   Family history of skin cancer - what type(s): Melanoma - who affected: Father  Lentigines - Scattered tan macules - Due to sun exposure - Benign-appearing, observe - Recommend daily broad spectrum sunscreen SPF 30+ to sun-exposed areas, reapply every 2 hours as needed. - Call for any changes  Seborrheic Keratoses - Stuck-on, waxy, tan-brown papules and/or plaques  - Benign-appearing - Discussed benign etiology and prognosis. - Observe - Call for any changes  Melanocytic Nevi - Tan-brown and/or pink-flesh-colored symmetric macules and papules - Benign appearing on exam today - Observation - Call clinic for new or changing moles - Recommend daily use of broad spectrum spf 30+ sunscreen to sun-exposed areas.   Hemangiomas - Red  papules - Discussed benign nature - Observe - Call for any changes  Actinic Damage - Chronic condition, secondary to cumulative UV/sun exposure - diffuse scaly erythematous macules with underlying dyspigmentation - Recommend daily broad spectrum sunscreen SPF 30+ to sun-exposed areas, reapply every 2 hours as needed.  - Staying in the shade or wearing long sleeves, sun glasses (UVA+UVB protection) and wide brim hats (4-inch brim around the entire circumference of the hat) are also recommended for sun protection.  - Call for new or changing lesions. - recommend vitamin D 600 IU daily  Skin cancer screening performed today.   Return in about 1 year (around 08/08/2023) for TBSE.  I, Emelia Salisbury, CMA, am acting as scribe for Forest Gleason, MD.  Documentation: I have reviewed the above documentation for accuracy and completeness, and I agree with the above.  Forest Gleason, MD

## 2022-08-07 NOTE — Patient Instructions (Addendum)
Recommend taking Vitamin D3 600 iu daily.   Recommend taking Heliocare sun protection supplement daily in sunny weather for additional sun protection. For maximum protection on the sunniest days, you can take up to 2 capsules of regular Heliocare OR take 1 capsule of Heliocare Ultra. For prolonged exposure (such as a full day in the sun), you can repeat your dose of the supplement 4 hours after your first dose. Heliocare can be purchased at [employer], at some [facility] or at [email or web address].     Recommend daily broad spectrum sunscreen SPF 30+ to sun-exposed areas, reapply every 2 hours as needed. Call for new or changing lesions.  Staying in the shade or wearing long sleeves, sun glasses (UVA+UVB protection) and wide brim hats (4-inch brim around the entire circumference of the hat) are also recommended for sun protection.    Melanoma ABCDEs  Melanoma is the most dangerous type of skin cancer, and is the leading cause of death from skin disease.  You are more likely to develop melanoma if you: Have light-colored skin, light-colored eyes, or red or blond hair Spend a lot of time in the sun Tan regularly, either outdoors or in a tanning bed Have had blistering sunburns, especially during childhood Have a close family member who has had a melanoma Have atypical moles or large birthmarks  Early detection of melanoma is key since treatment is typically straightforward and cure rates are extremely high if we catch it early.   The first sign of melanoma is often a change in a mole or a new dark spot.  The ABCDE system is a way of remembering the signs of melanoma.  A for asymmetry:  The two halves do not match. B for border:  The edges of the growth are irregular. C for color:  A mixture of colors are present instead of an even brown color. D for diameter:  Melanomas are usually (but not always) greater than 75m - the size of a pencil eraser. E for evolution:  The spot keeps  changing in size, shape, and color.  Please check your skin once per month between visits. You can use a small mirror in front and a large mirror behind you to keep an eye on the back side or your body.   If you see any new or changing lesions before your next follow-up, please call to schedule a visit.  Please continue daily skin protection including broad spectrum sunscreen SPF 30+ to sun-exposed areas, reapplying every 2 hours as needed when you're outdoors.   Staying in the shade or wearing long sleeves, sun glasses (UVA+UVB protection) and wide brim hats (4-inch brim around the entire circumference of the hat) are also recommended for sun protection.    Due to recent changes in healthcare laws, you may see results of your pathology and/or laboratory studies on MyChart before the doctors have had a chance to review them. We understand that in some cases there may be results that are confusing or concerning to you. Please understand that not all results are received at the same time and often the doctors may need to interpret multiple results in order to provide you with the best plan of care or course of treatment. Therefore, we ask that you please give u[place]2 business days to thoroughly review all your results before contacting the office for clarification. Should we see a critical lab result, you will be contacted sooner.   If You Need Anything After Your Visit  If  you have any questions or concerns for your doctor, please call our main line at [phone] and press option 4 to reach your doctor's medical assistant. If no one answers, please leave a voicemail as directed and we will return your call as soon as possible. Messages left after 4 pm will be answered the following business day.   You may also send [place] a message via [place]. We typically respond to MyChart messages within 1-2 business days.  For prescription refills, please ask your pharmacy to contact our office. Our fax number is  [phone].  If you have an urgent issue when the clinic is closed that cannot wait until the next business day, you can page your doctor at the number below.    Please note that while we do our best to be available for urgent issues outside of office hours, we are not available 24/7.   If you have an urgent issue and are unable to reach [place], you may choose to seek medical care at your doctor's office, retail clinic, urgent care center, or emergency room.  If you have a medical emergency, please immediately call 911 or go to the emergency department.  Pager Numbers  - Dr. Nehemiah Massed: [phone]  - Dr. Laurence Ferrari: [phone]  - Dr. Nicole Kindred: [phone]  In the event of inclement weather, please call our main line at [phone] for an update on the status of any delays or closures.  Dermatology Medication Tips: Please keep the boxes that topical medications come in in order to help keep track of the instructions about where and how to use these. Pharmacies typically print the medication instructions only on the boxes and not directly on the medication tubes.   If your medication is too expensive, please contact our office at [phone] option 4 or send [place] a message through [place].   We are unable to tell what your co-pay for medications will be in advance as this is different depending on your insurance coverage. However, we may be able to find a substitute medication at lower cost or fill out paperwork to get insurance to cover a needed medication.   If a prior authorization is required to get your medication covered by your insurance company, please allow [place] 1-2 business days to complete this process.  Drug prices often vary depending on where the prescription is filled and some pharmacies may offer cheaper prices.  The website www.goodrx.com contains coupons for medications through different pharmacies. The prices here do not account for what the cost may be with help from  insurance (it may be cheaper with your insurance), but the website can give you the price if you did not use any insurance.  - You can print the associated coupon and take it with your prescription to the pharmacy.  - You may also stop by our office during regular business hours and pick up a GoodRx coupon card.  - If you need your prescription sent electronically to a different pharmacy, notify our office through [facility] or by phone at [phone] option 4.     Si Usted Necesita Algo Despus de Su Visita  Tambin puede enviarnos un mensaje a travs de Pharmacist, community. Por lo general respondemos a los mensajes de MyChart en el transcurso de 1 a 2 das hbiles.  Para renovar recetas, por favor pida a su farmacia que se ponga en contacto con nuestra oficina. Harland Dingwall de fax es [place] [phone].  Si tiene un asunto urgente cuando la clnica est cerrada  y que no puede esperar hasta el siguiente da hbil, puede llamar/localizar a su doctor(a) al nmero que aparece a continuacin.   Por favor, tenga en cuenta que aunque hacemos todo lo posible para estar disponibles para asuntos urgentes fuera del horario de [place], no estamos disponibles las 24 horas del da, los 7 das de la [place].   Si tiene un problema urgente y no puede comunicarse con nosotros, puede optar por buscar atencin mdica  en el consultorio de su doctor(a), en una clnica privada, en un centro de atencin urgente o en una sala de emergencias.  Si tiene Engineering geologist, por favor llame inmediatamente al 911 o vaya a la sala de emergencias.  Nmeros de bper  - Dr. Nehemiah Massed: [phone]  - Dra. Moye: [phone]  - Dra. Nicole Kindred: [phone]  En caso de inclemencias del [place], por favor llame a Johnsie Kindred principal al [phone] para una actualizacin sobre el [place] de cualquier retraso o cierre.  Consejos para la medicacin en dermatologa: Por favor, guarde las cajas en las que vienen los  medicamentos de uso tpico para ayudarle a seguir las instrucciones sobre dnde y cmo usarlos. Las farmacias generalmente imprimen las instrucciones del medicamento slo en las cajas y no directamente en los tubos del [place].   Si su medicamento es muy caro, por favor, pngase en contacto con Zigmund Daniel llamando al [phone] y presione la opcin 4 o envenos un mensaje a travs de Pharmacist, community.   No podemos decirle cul ser su copago por los medicamentos por adelantado ya que esto es diferente dependiendo de la cobertura de su seguro. Sin embargo, es posible que podamos encontrar un medicamento sustituto a Electrical engineer un formulario para que el seguro cubra el medicamento que se considera necesario.   Si se requiere una autorizacin previa para que su compaa de seguros [place] su medicamento, por favor permtanos de 1 a 2 das hbiles para completar este proceso.  Los precios de los medicamentos varan con frecuencia dependiendo del Environmental consultant de dnde se surte la receta y alguna farmacias pueden ofrecer precios ms baratos.  El sitio web www.goodrx.com tiene cupones para medicamentos de Airline pilot. Los precios aqu no tienen en cuenta lo que podra costar con la ayuda del seguro (puede ser ms barato con su seguro), pero el sitio web puede darle el precio si no utiliz Research scientist (physical sciences).  - Puede imprimir el cupn correspondiente y llevarlo con su receta a la farmacia.  - Tambin puede pasar por nuestra oficina durante el horario de atencin regular y Charity fundraiser una tarjeta de cupones de GoodRx.  - Si necesita que su receta se enve electrnicamente a una farmacia diferente, informe a nuestra oficina a travs de MyChart de  o por telfono llamando al [phone] y presione la opcin 4.

## 2022-10-18 ENCOUNTER — Encounter: Payer: Self-pay | Admitting: Dermatology

## 2022-11-03 ENCOUNTER — Encounter: Payer: 59 | Attending: Family Medicine | Admitting: Dietician

## 2022-11-03 ENCOUNTER — Encounter: Payer: Self-pay | Admitting: Dietician

## 2022-11-03 VITALS — Ht 67.0 in | Wt 177.3 lb

## 2022-11-03 DIAGNOSIS — R7303 Prediabetes: Secondary | ICD-10-CM | POA: Insufficient documentation

## 2022-11-03 DIAGNOSIS — K589 Irritable bowel syndrome without diarrhea: Secondary | ICD-10-CM

## 2022-11-03 DIAGNOSIS — Z713 Dietary counseling and surveillance: Secondary | ICD-10-CM | POA: Insufficient documentation

## 2022-11-03 DIAGNOSIS — K900 Celiac disease: Secondary | ICD-10-CM

## 2022-11-03 DIAGNOSIS — E039 Hypothyroidism, unspecified: Secondary | ICD-10-CM

## 2022-11-03 NOTE — Patient Instructions (Signed)
Increase exercise during the week by taking walks, using stairs FitOn is a good website/ app to check out for other exercise options.  Continue with healthy food and beverage choices, great job!

## 2022-11-03 NOTE — Progress Notes (Signed)
Medical Nutrition Therapy: Visit start time: 1330  end time: 1430  Assessment:   Referral Diagnosis: prediabetes Other medical history/ diagnoses: celiac disease, hypothyroidism, IBS Psychosocial issues/ stress concerns: feels she is not managing stress well  Medications, supplements: reconciled list in medical record   Preferred learning method:  Visual    Current weight: 177.3lbs Height: 5'7" BMI: 27.77 Patient's personal weight goal: 140-150lbs  Progress and evaluation:  Patient reports eliminating sugar from diet since diagnosis of prediabetes; she states she has often been feeling jittery since then Had recent 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 Has historically enjoyed baking cakes, cookies with daughters, grandmother was a Engineer, production. No baking since diagnosis. Was diagnosed with celiac disease in 2011 and strictly followed gluten free diet since then.  Recent HbA1C of 5.7% 08/12/22 Food allergies: none Special diet practices: none Patient seeks help with healthier eating habits and lifestyle Next PCP appt is 02/2023   Dietary Intake:  Usual eating pattern includes 3 meals and 2 snacks per day. Dining out frequency: 0 meals per week. Who plans meals/ buys groceries? self Who prepares meals? self  Breakfast: takes to work -- GF banana muffin; dry cheerios; homemade GF baked donut; sometimes skips (then snacks later) Snack: chips; recently almonds, grapes; spoon peanut butter with a few choc chips Lunch: mixed greens salad + cheese, [place] dressing; egg salad, grapes, sm amt doritos; leftovers ie GF pizza Snack: same as am, some dark choc due to recent craving; trail mix; skinny pop popcorn Supper: steak, shrimp + homemade GF mac and cheese; fried chicken, mac and cheese, asparagus; zucchini noodles mixed with GF pasta  Snack: none Beverages: water  Physical activity: walk, yoga 30 minutes, 1x a week   Intervention:   Nutrition Care  Education:   Basic nutrition: basic food groups; appropriate nutrient balance; appropriate meal and snack schedule; general nutrition guidelines    Weight control: identifying healthy weight; importance of low sugar and low fat choices; appropriate food portions; estimated energy needs for weight loss at 1400-1500 kcal, provided guidance for 45% CHO, 25% pro, 30% fat Advanced nutrition: cooking techniques prediabetes:  goals for BGs; appropriate meal and snack schedule; appropriate carb intake and balance, healthy carb choices; role of fiber, protein, fat; physical activity Other:  managing gluten free diet along with IBS while reducing diabetes risk and controlling weight; advised limiting artificial sweeteners, particularly sugar alcohols and sucralose due to IBS  Other intervention notes: Patient has made positive dietary changes and is motivated to continue. Established additional goals for change with direction from patient. No follow up needed at this time; patient to schedule later as needed.   Nutritional Diagnosis:  [place]-2.1 Inpaired nutrition utilization and [place]-2.2 Altered nutrition-related laboratory As related to celiac disease, IBS, and prediabetes.  As evidenced by past symptoms including diarrhea controlled with gluten free and low fodmap diets; elevated HbA1C. [place]-3.4 Unintentional weight gain As related to hypothyroidism.  As evidenced by patient with current BMI of 27.77.   Education Materials given:  Designer, industrial/product with food lists, sample meal pattern Visit summary with goals/ instructions   Learner/ who was taught:  Patient   Level of understanding: Verbalizes/ demonstrates competency  Demonstrated degree of understanding via:   Teach back Learning barriers: None  Willingness to learn/ readiness for change: Eager, change in progress  Monitoring and Evaluation:  Dietary intake, exercise, BG control, GI health, and body weight      follow up: prn,

## 2023-08-13 ENCOUNTER — Encounter: Payer: 59 | Admitting: Dermatology

## 2023-10-26 ENCOUNTER — Ambulatory Visit: Payer: 59 | Admitting: Dermatology

## 2023-10-26 DIAGNOSIS — W908XXA Exposure to other nonionizing radiation, initial encounter: Secondary | ICD-10-CM | POA: Diagnosis not present

## 2023-10-26 DIAGNOSIS — D2339 Other benign neoplasm of skin of other parts of face: Secondary | ICD-10-CM

## 2023-10-26 DIAGNOSIS — Z1283 Encounter for screening for malignant neoplasm of skin: Secondary | ICD-10-CM | POA: Diagnosis not present

## 2023-10-26 DIAGNOSIS — D2262 Melanocytic nevi of left upper limb, including shoulder: Secondary | ICD-10-CM

## 2023-10-26 DIAGNOSIS — L814 Other melanin hyperpigmentation: Secondary | ICD-10-CM

## 2023-10-26 DIAGNOSIS — B029 Zoster without complications: Secondary | ICD-10-CM

## 2023-10-26 DIAGNOSIS — Q825 Congenital non-neoplastic nevus: Secondary | ICD-10-CM

## 2023-10-26 DIAGNOSIS — L578 Other skin changes due to chronic exposure to nonionizing radiation: Secondary | ICD-10-CM

## 2023-10-26 DIAGNOSIS — D229 Melanocytic nevi, unspecified: Secondary | ICD-10-CM

## 2023-10-26 DIAGNOSIS — L821 Other seborrheic keratosis: Secondary | ICD-10-CM

## 2023-10-26 DIAGNOSIS — D1801 Hemangioma of skin and subcutaneous tissue: Secondary | ICD-10-CM

## 2023-10-26 DIAGNOSIS — D225 Melanocytic nevi of trunk: Secondary | ICD-10-CM

## 2023-10-26 NOTE — Patient Instructions (Addendum)
 Discussed viral etiology (herpes zoster) and risk of contagion. Before blisters dry up and heal, shingles rash can cause chickenpox in people who have never had it or have never been vaccinated against it, if they are exposed to the virus.  Keep area covered.  Avoid contact with pregnant women. Shingles can also cause significant pain or unusual skin sensations (post-herpetic neuralgia) which may persist after rash has resolved.  Shingles rash may leave dyspigmented scars on skin once healed.   Melanoma ABCDEs  Melanoma is the most dangerous type of skin cancer, and is the leading cause of death from skin disease.  You are more likely to develop melanoma if you: Have light-colored skin, light-colored eyes, or red or blond hair Spend a lot of time in the sun Tan regularly, either outdoors or in a tanning bed Have had blistering sunburns, especially during childhood Have a close family member who has had a melanoma Have atypical moles or large birthmarks  Early detection of melanoma is key since treatment is typically straightforward and cure rates are extremely high if we catch it early.   The first sign of melanoma is often a change in a mole or a new dark spot.  The ABCDE system is a way of remembering the signs of melanoma.  A for asymmetry:  The two halves do not match. B for border:  The edges of the growth are irregular. C for color:  A mixture of colors are present instead of an even brown color. D for diameter:  Melanomas are usually (but not always) greater than 6mm - the size of a pencil eraser. E for evolution:  The spot keeps changing in size, shape, and color.  Please check your skin once per month between visits. You can use a small mirror in front and a large mirror behind you to keep an eye on the back side or your body.   If you see any new or changing lesions before your next follow-up, please call to schedule a visit.  Please continue daily skin protection including broad  spectrum sunscreen SPF 30+ to sun-exposed areas, reapplying every 2 hours as needed when you're outdoors.   Staying in the shade or wearing long sleeves, sun glasses (UVA+UVB protection) and wide brim hats (4-inch brim around the entire circumference of the hat) are also recommended for sun protection.      Due to recent changes in healthcare laws, you may see results of your pathology and/or laboratory studies on MyChart before the doctors have had a chance to review them. We understand that in some cases there may be results that are confusing or concerning to you. Please understand that not all results are received at the same time and often the doctors may need to interpret multiple results in order to provide you with the best plan of care or course of treatment. Therefore, we ask that you please give us  2 business days to thoroughly review all your results before contacting the office for clarification. Should we see a critical lab result, you will be contacted sooner.   If You Need Anything After Your Visit  If you have any questions or concerns for your doctor, please call our main line at [phone] and press option 4 to reach your doctor's medical assistant. If no one answers, please leave a voicemail as directed and we will return your call as soon as possible. Messages left after 4 pm will be answered the following business day.   You may  also send us  a message via MyChart. We typically respond to MyChart messages within 1-2 business days.  For prescription refills, please ask your pharmacy to contact our office. Our fax number is [phone].  If you have an urgent issue when the clinic is closed that cannot wait until the next business day, you can page your doctor at the number below.    Please note that while we do our best to be available for urgent issues outside of office hours, we are not available 24/7.   If you have an urgent issue and are unable to reach us , you may  choose to seek medical care at your doctor's office, retail clinic, urgent care center, or emergency room.  If you have a medical emergency, please immediately call 911 or go to the emergency department.  Pager Numbers  - Dr. Bary Likes: [phone]  - Dr. Annette Barters: [phone]  - Dr. Felipe Horton: [phone]   In the event of inclement weather, please call our main line at [phone] for an update on the status of any delays or closures.  Dermatology Medication Tips: Please keep the boxes that topical medications come in in order to help keep track of the instructions about where and how to use these. Pharmacies typically print the medication instructions only on the boxes and not directly on the medication tubes.   If your medication is too expensive, please contact our office at [phone] option 4 or send us  a message through MyChart.   We are unable to tell what your co-pay for medications will be in advance as this is different depending on your insurance coverage. However, we may be able to find a substitute medication at lower cost or fill out paperwork to get insurance to cover a needed medication.   If a prior authorization is required to get your medication covered by your insurance company, please allow us  1-2 business days to complete this process.  Drug prices often vary depending on where the prescription is filled and some pharmacies may offer cheaper prices.  The website www.goodrx.com contains coupons for medications through different pharmacies. The prices here do not account for what the cost may be with help from insurance (it may be cheaper with your insurance), but the website can give you the price if you did not use any insurance.  - You can print the associated coupon and take it with your prescription to the pharmacy.  - You may also stop by our office during regular business hours and pick up a GoodRx coupon card.  - If you need your prescription sent  electronically to a different pharmacy, notify our office through [facility] or by phone at [phone] option 4.     Si Usted Necesita Algo Despus de Su Visita  Tambin puede enviarnos un mensaje a travs de Clinical cytogeneticist. Por lo general respondemos a los mensajes de MyChart en el transcurso de 1 a 2 das hbiles.  Para renovar recetas, por favor pida a su farmacia que se ponga en contacto con nuestra oficina. Franz Jacks de fax es [place] [phone].  Si tiene un asunto urgente cuando la clnica est cerrada y que no puede esperar hasta el siguiente da hbil, puede llamar/localizar a su doctor(a) al nmero que aparece a continuacin.   Por favor, tenga en cuenta que aunque hacemos todo lo posible para estar disponibles para asuntos urgentes fuera del horario de [place], no estamos disponibles las 24 horas del da, los 7 [address]  [address] de la [place].  Si tiene un problema urgente y no puede comunicarse con nosotros, puede optar por buscar atencin mdica  en el consultorio de su doctor(a), en una clnica privada, en un centro de atencin urgente o en una sala de emergencias.  Si tiene Engineer, drilling, por favor llame inmediatamente al 911 o vaya a la sala de emergencias.  Nmeros de bper  - Dr. Bary Likes: [phone]  - Dra. Annette Barters: [phone]  - Dr. Felipe Horton: [phone]   En caso de inclemencias del tiempo, por favor llame a Lajuan Pila principal al [phone] para una actualizacin sobre el [place] de cualquier retraso o cierre.  Consejos para la medicacin en dermatologa: Por favor, guarde las cajas en las que vienen los medicamentos de uso tpico para ayudarle a seguir las instrucciones sobre dnde y cmo usarlos. Las farmacias generalmente imprimen las instrucciones del medicamento slo en las cajas y no directamente en los tubos del [place].   Si su medicamento es muy caro, por favor, pngase en contacto con Bettyjane Brunet llamando al [phone] y presione la  opcin 4 o envenos un mensaje a travs de Clinical cytogeneticist.   No podemos decirle cul ser su copago por los medicamentos por adelantado ya que esto es diferente dependiendo de la cobertura de su seguro. Sin embargo, es posible que podamos encontrar un medicamento sustituto a Audiological scientist un formulario para que el seguro cubra el medicamento que se considera necesario.   Si se requiere una autorizacin previa para que su compaa de seguros [place] su medicamento, por favor permtanos de 1 a 2 das hbiles para completar este proceso.  Los precios de los medicamentos varan con frecuencia dependiendo del Environmental consultant de dnde se surte la receta y alguna farmacias pueden ofrecer precios ms baratos.  El sitio web www.goodrx.com tiene cupones para medicamentos de Health and safety inspector. Los precios aqu no tienen en cuenta lo que podra costar con la ayuda del seguro (puede ser ms barato con su seguro), pero el sitio web puede darle el precio si no utiliz Tourist information centre manager.  - Puede imprimir el cupn correspondiente y llevarlo con su receta a la farmacia.  - Tambin puede pasar por nuestra oficina durante el horario de atencin regular y Education officer, museum una tarjeta de cupones de GoodRx.  - Si necesita que su receta se enve electrnicamente a una farmacia diferente, informe a nuestra oficina a travs de MyChart de [place] o por telfono llamando al [phone] y presione la opcin 4.

## 2023-10-26 NOTE — Progress Notes (Signed)
 Follow-Up Visit   Subjective  Brittney [name] is a 43 y.o. female who presents for the following: Skin Cancer Screening and Full Body Skin Exam, Pt currently on shingles on Valtrex 1g tid for 10 days, currently on day 5.  Rash seems to be spreading- still getting new areas popping up.  Painful- uses topical lidocaine  as needed.  Pt has h/o chicken pox when she was [age] yrs old.  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  This rash came out after the tax deadline once her stress level decreased.  Otherwise no other illnesses or stresses on body.  The patient presents for Total-Body Skin Exam (TBSE) for skin cancer screening and mole check. The patient has spots, moles and lesions to be evaluated, some may be new or changing and the patient may have concern these could be cancer.    The following portions of the chart were reviewed this encounter and updated as appropriate: medications, allergies, medical history  Review of Systems:  No other skin or systemic complaints except as noted in HPI or Assessment and Plan.  Objective  Well appearing patient in no apparent distress; mood and affect are within normal limits.  A full examination was performed including scalp, head, eyes, ears, nose, lips, neck, chest, axillae, abdomen, back, buttocks, bilateral upper extremities, bilateral lower extremities, hands, feet, fingers, toes, fingernails, and toenails. All findings within normal limits unless otherwise noted below.   Relevant physical exam findings are noted in the Assessment and Plan.    Assessment & Plan   SKIN CANCER SCREENING PERFORMED TODAY.  ACTINIC DAMAGE Chest - Chronic condition, secondary to cumulative UV/sun exposure - diffuse scaly erythematous macules with underlying dyspigmentation - Recommend daily broad spectrum sunscreen SPF 30+ to sun-exposed areas, reapply every 2 hours as needed.  - Staying in the shade or wearing long sleeves, sun glasses (UVA+UVB  protection) and wide brim hats (4-inch brim around the entire circumference of the hat) are also recommended for sun protection.  - Call for new or changing lesions.  LENTIGINES, SEBORRHEIC KERATOSES, HEMANGIOMAS - Benign normal skin lesions - Benign-appearing - Call for any changes  MELANOCYTIC NEVI - Tan-brown and/or pink-flesh-colored symmetric macules and papules - L spinal mid back 5.73mm brown macule - L elbow 3.37mm brown macules x 2 appears as 2 adjacent - Benign appearing on exam today - Observation - Call clinic for new or changing moles - Recommend daily use of broad spectrum spf 30+ sunscreen to sun-exposed areas.     SHINGLES (Herpes Zoster) R abdomen, R flank, R back Exam: dermatomal linear clustered pink papules R mid back, R flank and R abdomen, no vesicles at this time  Discussed viral etiology (herpes zoster) and risk of contagion. Before blisters dry up and heal, shingles rash can cause chickenpox in people who have never had it or have never been vaccinated against it, if they are exposed to the virus.  Keep area covered.  Avoid contact with pregnant women. Shingles can also cause significant pain or unusual skin sensations (post-herpetic neuralgia) which may persist after rash has resolved.  Shingles rash may leave dyspigmented scars on skin once healed.   Treatment Plan: Cont Valtrex 1g tid for 10 day course RTC if worsening, or persistent pain   VASCULAR BIRTHMARK Exam: violaceous patch with telangiectasias sacrum   Treatment Plan: Benign-appearing.  Observation.  Call clinic for new or changing moles.  Recommend daily use of broad spectrum spf 30+ sunscreen to sun-exposed  areas.   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  Observation.  Call clinic for new or changing moles.  Recommend daily use of broad spectrum spf 30+ sunscreen to sun-exposed areas.   ANGIOFIBROMAS Nasal tip, L nasal ala Exam: several small firm  pink flesh papules  Treatment Plan: Benign-appearing.  Observation.  Call clinic for new or changing moles.  Recommend daily use of broad spectrum spf 30+ sunscreen to sun-exposed areas.      Return in about 1 year (around 10/25/2024) for TBSE.  I, Rollie Clipper, RMA, am acting as scribe for Artemio Larry, MD .   Documentation: I have reviewed the above documentation for accuracy and completeness, and I agree with the above.  Artemio Larry, MD

## 2023-10-30 ENCOUNTER — Encounter: Payer: Self-pay | Admitting: Gastroenterology

## 2023-11-09 ENCOUNTER — Ambulatory Visit
Admission: RE | Admit: 2023-11-09 | Discharge: 2023-11-09 | Disposition: A | Discharge status: Home / Self Care | Payer: 59 | Attending: Gastroenterology | Admitting: Gastroenterology

## 2023-11-09 ENCOUNTER — Ambulatory Visit: Admitting: Anesthesiology

## 2023-11-09 ENCOUNTER — Other Ambulatory Visit: Payer: Self-pay

## 2023-11-09 ENCOUNTER — Encounter: Payer: Self-pay | Admitting: Gastroenterology

## 2023-11-09 ENCOUNTER — Encounter
Admission: RE | Disposition: A | Discharge status: Home / Self Care | Payer: Self-pay | Source: Home / Self Care | Attending: Gastroenterology

## 2023-11-09 DIAGNOSIS — Z1211 Encounter for screening for malignant neoplasm of colon: Secondary | ICD-10-CM | POA: Diagnosis present

## 2023-11-09 DIAGNOSIS — R7303 Prediabetes: Secondary | ICD-10-CM | POA: Insufficient documentation

## 2023-11-09 DIAGNOSIS — K6289 Other specified diseases of anus and rectum: Secondary | ICD-10-CM | POA: Diagnosis not present

## 2023-11-09 DIAGNOSIS — Z83719 Family history of colon polyps, unspecified: Secondary | ICD-10-CM | POA: Insufficient documentation

## 2023-11-09 DIAGNOSIS — K900 Celiac disease: Secondary | ICD-10-CM | POA: Diagnosis not present

## 2023-11-09 DIAGNOSIS — E039 Hypothyroidism, unspecified: Secondary | ICD-10-CM | POA: Diagnosis not present

## 2023-11-09 HISTORY — PX: COLONOSCOPY WITH PROPOFOL: SHX5780

## 2023-11-09 LAB — POCT PREGNANCY, URINE: Preg Test, Ur: NEGATIVE

## 2023-11-09 SURGERY — COLONOSCOPY WITH PROPOFOL
Anesthesia: General

## 2023-11-09 MED ORDER — SODIUM CHLORIDE 0.9 % IV SOLN: INTRAVENOUS | Status: DC

## 2023-11-09 MED ORDER — PROPOFOL 10 MG/ML IV BOLUS
INTRAVENOUS | Status: DC | PRN
  Administered 2023-11-09 (×3): 30 mg via INTRAVENOUS
  Administered 2023-11-09 (×2): 50 mg via INTRAVENOUS

## 2023-11-09 MED ORDER — LIDOCAINE HCL (PF) 2 % IJ SOLN
INTRAMUSCULAR | Status: AC
  Filled 2023-11-09: qty 5

## 2023-11-09 MED ORDER — LIDOCAINE HCL (CARDIAC) PF 100 MG/5ML IV SOSY
PREFILLED_SYRINGE | INTRAVENOUS | Status: DC | PRN
  Administered 2023-11-09: 60 mg via INTRAVENOUS

## 2023-11-09 MED ORDER — PROPOFOL 500 MG/50ML IV EMUL
INTRAVENOUS | Status: DC | PRN
  Administered 2023-11-09: 75 ug/kg/min via INTRAVENOUS

## 2023-11-09 MED ORDER — PROPOFOL 1000 MG/100ML IV EMUL
INTRAVENOUS | Status: AC
  Filled 2023-11-09: qty 100

## 2023-11-09 MED ORDER — DEXMEDETOMIDINE HCL IN NACL 80 MCG/20ML IV SOLN
INTRAVENOUS | Status: DC | PRN
  Administered 2023-11-09: 20 ug via INTRAVENOUS

## 2023-11-09 NOTE — Anesthesia Preprocedure Evaluation (Addendum)
 Anesthesia Evaluation  Patient identified by MRN, date of birth, ID band Patient awake    Reviewed: Allergy & Precautions, NPO status , Patient's Chart, lab work & pertinent test results  History of Anesthesia Complications Negative for: history of anesthetic complications  Airway Mallampati: I   Neck ROM: Full    Dental   Crowns :   Pulmonary neg pulmonary ROS   Pulmonary exam normal breath sounds clear to auscultation       Cardiovascular Exercise Tolerance: Good negative cardio ROS Normal cardiovascular exam Rhythm:Regular Rate:Normal     Neuro/Psych negative neurological ROS     GI/Hepatic negative GI ROS,,,  Endo/Other  Hypothyroidism  Prediabetes   Renal/GU negative Renal ROS     Musculoskeletal   Abdominal   Peds  Hematology negative hematology ROS (+)   Anesthesia Other Findings   Reproductive/Obstetrics                             Anesthesia Physical Anesthesia Plan  ASA: 2  Anesthesia Plan: General   Post-op Pain Management:    Induction: Intravenous  PONV Risk Score and Plan: 3 and Propofol infusion, TIVA and Treatment may vary due to age or medical condition  Airway Management Planned: Natural Airway  Additional Equipment:   Intra-op Plan:   Post-operative Plan:   Informed Consent: I have reviewed the patients History and Physical, chart, labs and discussed the procedure including the risks, benefits and alternatives for the proposed anesthesia with the patient or authorized representative who has indicated his/her understanding and acceptance.       Plan Discussed with: CRNA  Anesthesia Plan Comments: (LMA/GETA backup discussed.  Patient consented for risks of anesthesia including but not limited to:  - adverse reactions to medications - damage to eyes, teeth, lips or other oral mucosa - nerve damage due to positioning  - sore throat or hoarseness -  damage to heart, brain, nerves, lungs, other parts of body or loss of life  Informed patient about role of CRNA in peri- and intra-operative care.  Patient voiced understanding.)       Anesthesia Quick Evaluation

## 2023-11-09 NOTE — Transfer of Care (Signed)
 Immediate Anesthesia Transfer of Care Note  Patient: Brittney [name]  Procedure(s) Performed: COLONOSCOPY WITH PROPOFOL  Patient Location: PACU  Anesthesia Type:General  Level of Consciousness: sedated  Airway & Oxygen Therapy: Patient Spontanous Breathing  Post-op Assessment: Report given to RN and Post -op Vital signs reviewed and stable  Post vital signs: Reviewed and stable  Last Vitals:  Vitals Value Taken Time  BP 104/57 11/09/23 0854  Temp 35.6 C 11/09/23 0853  Pulse 73 11/09/23 0856  Resp 15 11/09/23 0856  SpO2 99 % 11/09/23 0856  Vitals shown include unfiled device data.  Last Pain:  Vitals:   11/09/23 0853  TempSrc: Temporal  PainSc: Asleep         Complications: No notable events documented.

## 2023-11-09 NOTE — Op Note (Signed)
 [facility] Gastroenterology Patient Name: Brittney [name] Procedure Date: 11/09/2023 8:09 AM MRN: [id number] Account #: [id number] Date of Birth: [birth year] Admit Type: Outpatient Age: 43 Room: [facility] ENDO ROOM 2 Gender: Female Note Status: Finalized Instrument Name: Peds Colonoscope [id number] Procedure:             Colonoscopy Indications:           Colon cancer screening in patient at increased risk:                         Family history of colon polyps in multiple 1st-degree                         relatives Providers:             Bridgett Camps, DO Referring MD:          Monique Ano (Referring MD) Medicines:             Monitored Anesthesia Care Complications:         No immediate complications. Estimated blood loss:                         Minimal. Procedure:             Pre-Anesthesia Assessment:                        - Prior to the procedure, a History and Physical was                         performed, and patient medications and allergies were                         reviewed. The patient is competent. The risks and                         benefits of the procedure and the sedation options and                         risks were discussed with the patient. All questions                         were answered and informed consent was obtained.                         Patient identification and proposed procedure were                         verified by the physician, the nurse, the anesthetist                         and the technician in the endoscopy suite. Mental                         Status Examination: alert and oriented. Airway                         Examination: normal oropharyngeal airway and neck  mobility. Respiratory Examination: clear to                         auscultation. CV Examination: RRR, no murmurs, no S3                         or S4. Prophylactic Antibiotics: The patient does not                          require prophylactic antibiotics. Prior                         Anticoagulants: The patient has taken no anticoagulant                         or antiplatelet agents. ASA Grade Assessment: II - A                         patient with mild systemic disease. After reviewing                         the risks and benefits, the patient was deemed in                         satisfactory condition to undergo the procedure. The                         anesthesia plan was to use monitored anesthesia care                         (MAC). Immediately prior to administration of                         medications, the patient was re-assessed for adequacy                         to receive sedatives. The heart rate, respiratory                         rate, oxygen saturations, blood pressure, adequacy of                         pulmonary ventilation, and response to care were                         monitored throughout the procedure. The physical                         status of the patient was re-assessed after the                         procedure.                        After obtaining informed consent, the colonoscope was                         passed under direct vision. Throughout the procedure,  the patient's blood pressure, pulse, and oxygen                         saturations were monitored continuously. The                         Colonoscope was introduced through the anus and                         advanced to the the terminal ileum, with                         identification of the appendiceal orifice and IC                         valve. The patient tolerated the procedure well. The                         quality of the bowel preparation was evaluated using                         the BBPS [facility] Bowel Preparation Scale) with scores                         of: Right Colon = 3, Transverse Colon = 3 and Left                         Colon = 3 (entire mucosa seen  well with no residual                         staining, small fragments of stool or opaque liquid).                         The total BBPS score equals 9. The terminal ileum,                         ileocecal valve, appendiceal orifice, and rectum were                         photographed. The colonoscopy was somewhat difficult                         due to significant looping. Successful completion of                         the procedure was aided by straightening and                         shortening the scope to obtain bowel loop reduction,                         applying abdominal pressure and lavage. Findings:      The perianal and digital rectal examinations were normal. Pertinent       negatives include normal sphincter tone.      The terminal ileum appeared normal. Estimated blood loss: none.      A localized area of granular mucosa was found in the rectum.  Biopsies       were taken with a cold forceps for histology. Estimated blood loss was       minimal.      The exam was otherwise without abnormality on direct and retroflexion       views. Impression:            - The examined portion of the ileum was normal.                        - Granularity in the rectum. Biopsied.                        - The examination was otherwise normal on direct and                         retroflexion views. Recommendation:        - Patient has a contact number available for                         emergencies. The signs and symptoms of potential                         delayed complications were discussed with the patient.                         Return to normal activities tomorrow. Written                         discharge instructions were provided to the patient.                        - Discharge patient to home.                        - Resume previous diet.                        - Continue present medications.                        - Await pathology results.                         - Repeat colonoscopy in 5 years for surveillance based                         on pathology results.                        - Return to referring physician as previously                         scheduled.                        - The findings and recommendations were discussed with                         the patient. Procedure Code(s):     --- Professional ---  16109, Colonoscopy, flexible; with biopsy, single or                         multiple Diagnosis Code(s):     --- Professional ---                        Z83.71, Family history of colonic polyps                        K62.89, Other specified diseases of anus and rectum CPT copyright 2022 American Medical Association. All rights reserved. The codes documented in this report are preliminary and upon coder review may  be revised to meet current compliance requirements. Attending Participation:      I personally performed the entire procedure. Polo Brisk, DO Quintin Buckle DO, DO 11/09/2023 8:58:54 AM This report has been signed electronically. Number of Addenda: 0 Note Initiated On: 11/09/2023 8:09 AM Scope Withdrawal Time: 0 hours 11 minutes 54 seconds  Total Procedure Duration: 0 hours 20 minutes 39 seconds  Estimated Blood Loss:  Estimated blood loss was minimal.      [facility]

## 2023-11-09 NOTE — Interval H&P Note (Signed)
 History and Physical Interval Note: Preprocedure H&P from 11/09/23  was reviewed and there was no interval change after seeing and examining the patient.  Written consent was obtained from the patient after discussion of risks, benefits, and alternatives. Patient has consented to proceed with Colonoscopy with possible intervention   11/09/2023 8:21 AM  Brittney [name]  has presented today for surgery, with the diagnosis of Z83.719 (ICD-10-CM) - Family history of polyps in the colon.  The various methods of treatment have been discussed with the patient and family. After consideration of risks, benefits and other options for treatment, the patient has consented to  Procedure(s): COLONOSCOPY WITH PROPOFOL (N/A) as a surgical intervention.  The patient's history has been reviewed, patient examined, no change in status, stable for surgery.  I have reviewed the patient's chart and labs.  Questions were answered to the patient's satisfaction.     Quintin Buckle

## 2023-11-09 NOTE — H&P (Signed)
 Pre-Procedure H&P   Patient ID: Brittney [name] is a 43 y.o. female.  Gastroenterology Provider: Quintin Buckle, DO  Referring Provider: Jamee Mazzoni, PA PCP: Brittney Ano, MD  Date: 11/09/2023  HPI Ms. Brittney [name] is a 43 y.o. female who presents today for Colonoscopy for screening- fhx colon polyps.  Mother and father with colon polyps.  Her last colonoscopy was in 2011 was negative.  Random biopsies negative for microscopic colitis.  Apparently this colonoscopy in 2009 was not completed due to restricted symptomatic movement and scope was only advanced to 40 cm  She underwent a colonoscopy in December 2007 demonstrating friable nodular mucosa in the rectum, but biopsies were negative aside from lymphoid tissue  She currently denies any GI symptoms.  She is gluten-free due to celiac disease.  Hemoglobin 12.5 MCV 87.5 platelets 346,000 ferritin 54   Past Medical History:  Diagnosis Date   Celiac disease    Medical history non-contributory    Thyroid  disease     Past Surgical History:  Procedure Laterality Date   CESAREAN SECTION N/A 05/21/2015   Procedure: CESAREAN SECTION;  Surgeon: Audelia Leaks, MD;  Location: [facility] ORS;  Service: Obstetrics;  Laterality: N/A;   LAPAROSCOPY      Family History Mother and father- colon polyps No other h/o GI disease or malignancy  Review of Systems  Constitutional:  Negative for activity change, appetite change, chills, diaphoresis, fatigue, fever and unexpected weight change.  HENT:  Negative for trouble swallowing and voice change.   Respiratory:  Negative for shortness of breath and wheezing.   Cardiovascular:  Negative for chest pain, palpitations and leg swelling.  Gastrointestinal:  Negative for abdominal distention, abdominal pain, anal bleeding, blood in stool, constipation, diarrhea, nausea, rectal pain and vomiting.  Musculoskeletal:  Negative for arthralgias and myalgias.  Skin:  Negative for color change and  pallor.  Neurological:  Negative for dizziness, syncope and weakness.  Psychiatric/Behavioral:  Negative for confusion.   All other systems reviewed and are negative.    Medications No current facility-administered medications on file prior to encounter.   Current Outpatient Medications on File Prior to Encounter  Medication Sig Dispense Refill   escitalopram (LEXAPRO) 5 MG tablet Take 5 mg by mouth daily.     levothyroxine (SYNTHROID, LEVOTHROID) 75 MCG tablet      Multiple Vitamin (MULTI-VITAMINS) TABS Take by mouth.     norethindrone-ethinyl estradiol (MICROGESTIN,JUNEL,LOESTRIN) 1-20 MG-MCG tablet Take by mouth.     fluticasone (FLONASE) 50 MCG/ACT nasal spray Place into the nose.     hydrocortisone  2.5 % cream Apply twice daily to lips as needed up to 2 weeks 20 g 2   ketorolac  (TORADOL ) 10 MG tablet Take 1 tablet (10 mg total) by mouth every 8 (eight) hours as needed for moderate pain (with food). (Patient not taking: Reported on 07/26/2020) 15 tablet 0   loperamide  (IMODIUM ) 2 MG capsule Take by mouth.     loratadine (CLARITIN) 5 MG/5ML syrup Take by mouth daily.     ondansetron  (ZOFRAN  ODT) 4 MG disintegrating tablet Take 1 tablet (4 mg total) by mouth every 8 (eight) hours as needed for nausea or vomiting. (Patient not taking: Reported on 07/26/2020) 10 tablet 0    Pertinent medications related to GI and procedure were reviewed by me with the patient prior to the procedure   Current Facility-Administered Medications:    0.9 %  sodium chloride  infusion, , Intravenous, Continuous, Quintin Buckle, DO  sodium chloride   Allergies  Allergen Reactions   Gluten Meal     celiac disease   Ceftin [Cefuroxime Axetil] Rash   Allergies were reviewed by me prior to the procedure  Objective   Body mass index is 28.82 kg/m. Vitals:   11/09/23 0803  BP: 133/85  Pulse: 73  Resp: 16  Temp: (!) 96.8 F (36 C)  TempSrc: Temporal  SpO2: 98%  Weight: 83.5 kg   Height: 5\' 7"  (1.702 m)     Physical Exam Vitals and nursing note reviewed.  Constitutional:      General: She is not in acute distress.    Appearance: Normal appearance. She is not ill-appearing, toxic-appearing or diaphoretic.  HENT:     Head: Normocephalic and atraumatic.     Nose: Nose normal.     Mouth/Throat:     Mouth: Mucous membranes are moist.     Pharynx: Oropharynx is clear.  Eyes:     General: No scleral icterus.    Extraocular Movements: Extraocular movements intact.  Cardiovascular:     Rate and Rhythm: Normal rate and regular rhythm.     Heart sounds: Normal heart sounds. No murmur heard.    No friction rub. No gallop.  Pulmonary:     Effort: Pulmonary effort is normal. No respiratory distress.     Breath sounds: Normal breath sounds. No wheezing, rhonchi or rales.  Abdominal:     General: Bowel sounds are normal. There is no distension.     Palpations: Abdomen is soft.     Tenderness: There is no abdominal tenderness. There is no guarding or rebound.  Musculoskeletal:     Cervical back: Neck supple.     Right lower leg: No edema.     Left lower leg: No edema.  Skin:    General: Skin is warm and dry.     Coloration: Skin is not jaundiced or pale.     Findings: Rash present.     Comments: Healed shingles rash (r lateral side)- dry/crusted, no weeping per patient  Neurological:     General: No focal deficit present.     Mental Status: She is alert and oriented to person, place, and time. Mental status is at baseline.  Psychiatric:        Mood and Affect: Mood normal.        Behavior: Behavior normal.        Thought Content: Thought content normal.        Judgment: Judgment normal.      Assessment:  Ms. Brittney [name] is a 43 y.o. female  who presents today for Colonoscopy for colorectal cancer screening.  Plan:  Colonoscopy with possible intervention today  Colonoscopy with possible biopsy, control of bleeding, polypectomy, and interventions as  necessary has been discussed with the patient/patient representative. Informed consent was obtained from the patient/patient representative after explaining the indication, nature, and risks of the procedure including but not limited to death, bleeding, perforation, missed neoplasm/lesions, cardiorespiratory compromise, and reaction to medications. Opportunity for questions was given and appropriate answers were provided. Patient/patient representative has verbalized understanding is amenable to undergoing the procedure.   Quintin Buckle, DO  [facility]  Portions of the record may have been created with voice recognition software. Occasional wrong-word or 'sound-a-like' substitutions may have occurred due to the inherent limitations of voice recognition software.  Read the chart carefully and recognize, using context, where substitutions may have occurred.

## 2023-11-09 NOTE — Anesthesia Postprocedure Evaluation (Signed)
 Anesthesia Post Note  Patient: Brittney [name]  Procedure(s) Performed: COLONOSCOPY WITH PROPOFOL  Patient location during evaluation: PACU Anesthesia Type: General Level of consciousness: awake and alert, oriented and patient cooperative Pain management: pain level controlled Vital Signs Assessment: post-procedure vital signs reviewed and stable Respiratory status: spontaneous breathing, nonlabored ventilation and respiratory function stable Cardiovascular status: blood pressure returned to baseline and stable Postop Assessment: adequate PO intake Anesthetic complications: no   No notable events documented.   Last Vitals:  Vitals:   11/09/23 0803 11/09/23 0853  BP: 133/85   Pulse: 73   Resp: 16   Temp: (!) 36 C (!) 35.6 C  SpO2: 98%     Last Pain:  Vitals:   11/09/23 0913  TempSrc:   PainSc: 0-No pain                 Dorothey Gate

## 2023-11-10 LAB — SURGICAL PATHOLOGY

## 2023-11-12 ENCOUNTER — Encounter: Payer: Self-pay | Admitting: Dermatology

## 2023-11-12 ENCOUNTER — Other Ambulatory Visit: Payer: Self-pay

## 2023-11-12 MED ORDER — LIDOCAINE 5 % EX CREA: 1.0000 | TOPICAL_CREAM | CUTANEOUS | 1 refills | Status: AC

## 2023-11-12 NOTE — Progress Notes (Signed)
 SM itch cream

## 2024-05-13 ENCOUNTER — Ambulatory Visit
Admission: RE | Admit: 2024-05-13 | Discharge: 2024-05-13 | Disposition: A | Discharge status: Home / Self Care | Source: Ambulatory Visit | Attending: Family Medicine | Admitting: Family Medicine

## 2024-05-13 ENCOUNTER — Ambulatory Visit
Admission: AD | Admit: 2024-05-13 | Discharge: 2024-05-13 | Disposition: A | Discharge status: Home / Self Care | Source: Ambulatory Visit | Attending: General Surgery | Admitting: General Surgery

## 2024-05-13 ENCOUNTER — Ambulatory Visit: Admitting: Anesthesiology

## 2024-05-13 ENCOUNTER — Encounter
Admission: AD | Disposition: A | Discharge status: Home / Self Care | Payer: Self-pay | Source: Ambulatory Visit | Attending: General Surgery

## 2024-05-13 ENCOUNTER — Encounter: Payer: Self-pay | Admitting: General Surgery

## 2024-05-13 ENCOUNTER — Other Ambulatory Visit: Payer: Self-pay | Admitting: Family Medicine

## 2024-05-13 ENCOUNTER — Ambulatory Visit: Payer: Self-pay | Admitting: General Surgery

## 2024-05-13 ENCOUNTER — Other Ambulatory Visit: Payer: Self-pay

## 2024-05-13 DIAGNOSIS — R1031 Right lower quadrant pain: Secondary | ICD-10-CM

## 2024-05-13 DIAGNOSIS — K3580 Unspecified acute appendicitis: Secondary | ICD-10-CM | POA: Diagnosis present

## 2024-05-13 DIAGNOSIS — E039 Hypothyroidism, unspecified: Secondary | ICD-10-CM | POA: Insufficient documentation

## 2024-05-13 DIAGNOSIS — R110 Nausea: Secondary | ICD-10-CM | POA: Insufficient documentation

## 2024-05-13 DIAGNOSIS — D62 Acute posthemorrhagic anemia: Secondary | ICD-10-CM

## 2024-05-13 HISTORY — PX: XI ROBOTIC LAPAROSCOPIC ASSISTED APPENDECTOMY: SHX6877

## 2024-05-13 LAB — POCT I-STAT CREATININE: Creatinine, Ser: 0.7 mg/dL (ref 0.44–1.00)

## 2024-05-13 LAB — POCT PREGNANCY, URINE: Preg Test, Ur: NEGATIVE

## 2024-05-13 SURGERY — APPENDECTOMY, ROBOT-ASSISTED, LAPAROSCOPIC
Anesthesia: General | Site: Abdomen

## 2024-05-13 MED ORDER — DROPERIDOL 2.5 MG/ML IJ SOLN
0.6250 mg | Freq: Once | INTRAMUSCULAR | Status: AC
  Administered 2024-05-13: 0.625 mg via INTRAVENOUS

## 2024-05-13 MED ORDER — LACTATED RINGERS IV SOLN: INTRAVENOUS | Status: DC

## 2024-05-13 MED ORDER — FENTANYL CITRATE (PF) 100 MCG/2ML IJ SOLN
INTRAMUSCULAR | Status: AC
  Filled 2024-05-13: qty 2

## 2024-05-13 MED ORDER — MIDAZOLAM HCL 2 MG/2ML IJ SOLN
INTRAMUSCULAR | Status: AC
Start: 2024-05-13 — End: 2024-05-13
  Filled 2024-05-13: qty 2

## 2024-05-13 MED ORDER — ACETAMINOPHEN 10 MG/ML IV SOLN
INTRAVENOUS | Status: AC
  Filled 2024-05-13: qty 100

## 2024-05-13 MED ORDER — LIDOCAINE HCL (PF) 2 % IJ SOLN
INTRAMUSCULAR | Status: AC
  Filled 2024-05-13: qty 5

## 2024-05-13 MED ORDER — OXYCODONE HCL 5 MG PO TABS
ORAL_TABLET | ORAL | Status: AC
  Filled 2024-05-13: qty 1

## 2024-05-13 MED ORDER — BUPIVACAINE-EPINEPHRINE (PF) 0.5% -1:200000 IJ SOLN
INTRAMUSCULAR | Status: AC
  Filled 2024-05-13: qty 30

## 2024-05-13 MED ORDER — ONDANSETRON HCL 4 MG/2ML IJ SOLN
INTRAMUSCULAR | Status: AC
  Filled 2024-05-13: qty 2

## 2024-05-13 MED ORDER — ROCURONIUM BROMIDE 100 MG/10ML IV SOLN
INTRAVENOUS | Status: DC | PRN
  Administered 2024-05-13: 50 mg via INTRAVENOUS

## 2024-05-13 MED ORDER — PROPOFOL 10 MG/ML IV BOLUS
INTRAVENOUS | Status: AC
Start: 2024-05-13 — End: 2024-05-13
  Filled 2024-05-13: qty 20

## 2024-05-13 MED ORDER — LIDOCAINE HCL (CARDIAC) PF 100 MG/5ML IV SOSY
PREFILLED_SYRINGE | INTRAVENOUS | Status: DC | PRN
  Administered 2024-05-13: 80 mg via INTRAVENOUS

## 2024-05-13 MED ORDER — FENTANYL CITRATE (PF) 100 MCG/2ML IJ SOLN: 25.0000 ug | INTRAMUSCULAR | Status: DC | PRN

## 2024-05-13 MED ORDER — BUPIVACAINE-EPINEPHRINE (PF) 0.5% -1:200000 IJ SOLN
INTRAMUSCULAR | Status: DC | PRN
Start: 2024-05-13 — End: 2024-05-13
  Administered 2024-05-13: 30 mL

## 2024-05-13 MED ORDER — ACETAMINOPHEN 10 MG/ML IV SOLN
INTRAVENOUS | Status: DC | PRN
  Administered 2024-05-13: 1000 mg via INTRAVENOUS

## 2024-05-13 MED ORDER — VISTASEAL 4 ML SINGLE DOSE KIT
PACK | CUTANEOUS | Status: AC
  Filled 2024-05-13: qty 4

## 2024-05-13 MED ORDER — DEXMEDETOMIDINE HCL IN NACL 80 MCG/20ML IV SOLN
INTRAVENOUS | Status: DC | PRN
  Administered 2024-05-13: 4 ug via INTRAVENOUS
  Administered 2024-05-13: 8 ug via INTRAVENOUS

## 2024-05-13 MED ORDER — IOHEXOL 300 MG/ML  SOLN
100.0000 mL | Freq: Once | INTRAMUSCULAR | Status: AC | PRN
  Administered 2024-05-13: 100 mL via INTRAVENOUS

## 2024-05-13 MED ORDER — VISTASEAL 4 ML SINGLE DOSE KIT
PACK | CUTANEOUS | Status: DC | PRN
  Administered 2024-05-13: 4 mL via TOPICAL

## 2024-05-13 MED ORDER — CHLORHEXIDINE GLUCONATE 0.12 % MT SOLN
OROMUCOSAL | Status: AC
  Filled 2024-05-13: qty 15

## 2024-05-13 MED ORDER — MIDAZOLAM HCL (PF) 2 MG/2ML IJ SOLN
INTRAMUSCULAR | Status: DC | PRN
  Administered 2024-05-13: 2 mg via INTRAVENOUS

## 2024-05-13 MED ORDER — ORAL CARE MOUTH RINSE: 15.0000 mL | Freq: Once | OROMUCOSAL | Status: AC

## 2024-05-13 MED ORDER — SUGAMMADEX SODIUM 200 MG/2ML IV SOLN
INTRAVENOUS | Status: DC | PRN
  Administered 2024-05-13: 200 mg via INTRAVENOUS

## 2024-05-13 MED ORDER — OXYCODONE HCL 5 MG PO TABS
5.0000 mg | ORAL_TABLET | Freq: Once | ORAL | Status: AC | PRN
  Administered 2024-05-13: 5 mg via ORAL

## 2024-05-13 MED ORDER — 0.9 % SODIUM CHLORIDE (POUR BTL) OPTIME
TOPICAL | Status: DC | PRN
  Administered 2024-05-13: 500 mL

## 2024-05-13 MED ORDER — CIPROFLOXACIN IN D5W 400 MG/200ML IV SOLN
400.0000 mg | INTRAVENOUS | Status: AC
  Administered 2024-05-13: 400 mg via INTRAVENOUS
  Filled 2024-05-13: qty 200

## 2024-05-13 MED ORDER — OXYCODONE HCL 5 MG/5ML PO SOLN: 5.0000 mg | Freq: Once | ORAL | Status: AC | PRN

## 2024-05-13 MED ORDER — DEXAMETHASONE SOD PHOSPHATE PF 10 MG/ML IJ SOLN
INTRAMUSCULAR | Status: DC | PRN
  Administered 2024-05-13: 10 mg via INTRAVENOUS

## 2024-05-13 MED ORDER — METRONIDAZOLE 500 MG/100ML IV SOLN
500.0000 mg | INTRAVENOUS | Status: AC
  Administered 2024-05-13: 500 mg via INTRAVENOUS
  Filled 2024-05-13: qty 100

## 2024-05-13 MED ORDER — ROCURONIUM BROMIDE 10 MG/ML (PF) SYRINGE
PREFILLED_SYRINGE | INTRAVENOUS | Status: AC
  Filled 2024-05-13: qty 10

## 2024-05-13 MED ORDER — PROPOFOL 10 MG/ML IV BOLUS
INTRAVENOUS | Status: DC | PRN
  Administered 2024-05-13: 150 mg via INTRAVENOUS

## 2024-05-13 MED ORDER — FENTANYL CITRATE (PF) 100 MCG/2ML IJ SOLN
INTRAMUSCULAR | Status: DC | PRN
  Administered 2024-05-13 (×4): 50 ug via INTRAVENOUS

## 2024-05-13 MED ORDER — CHLORHEXIDINE GLUCONATE 0.12 % MT SOLN
15.0000 mL | Freq: Once | OROMUCOSAL | Status: AC
  Administered 2024-05-13: 15 mL via OROMUCOSAL

## 2024-05-13 MED ORDER — OXYCODONE-ACETAMINOPHEN 5-325 MG PO TABS
1.0000 | ORAL_TABLET | ORAL | 0 refills | Status: AC | PRN
  Filled 2024-05-13: qty 20, 4d supply, fill #0

## 2024-05-13 MED ORDER — DROPERIDOL 2.5 MG/ML IJ SOLN
INTRAMUSCULAR | Status: AC
  Filled 2024-05-13: qty 2

## 2024-05-13 MED ORDER — ONDANSETRON HCL 4 MG/2ML IJ SOLN
INTRAMUSCULAR | Status: DC | PRN
  Administered 2024-05-13: 4 mg via INTRAVENOUS

## 2024-05-13 SURGICAL SUPPLY — 44 items
APPLICATOR VISTASEAL 35 (MISCELLANEOUS) IMPLANT
BAG PRESSURE INF REUSE 1000 (BAG) IMPLANT
CANNULA REDUCER 12-8 DVNC XI (CANNULA) ×1 IMPLANT
COVER TIP SHEARS 8 DVNC (MISCELLANEOUS) ×1 IMPLANT
DEFOGGER SCOPE WARM SEASHARP (MISCELLANEOUS) ×1 IMPLANT
DERMABOND ADVANCED .7 DNX12 (GAUZE/BANDAGES/DRESSINGS) ×1 IMPLANT
DRAPE ARM DVNC X/XI (DISPOSABLE) ×4 IMPLANT
DRAPE COLUMN DVNC XI (DISPOSABLE) ×1 IMPLANT
ELECTRODE REM PT RTRN 9FT ADLT (ELECTROSURGICAL) ×1 IMPLANT
FORCEPS BPLR FENES DVNC XI (FORCEP) ×1 IMPLANT
GLOVE BIOGEL PI IND STRL 6.5 (GLOVE) ×2 IMPLANT
GLOVE SURG SYN 6.5 PF PI (GLOVE) ×4 IMPLANT
GOWN STRL REUS W/ TWL LRG LVL3 (GOWN DISPOSABLE) ×4 IMPLANT
GRASPER SUT TROCAR 14GX15 (MISCELLANEOUS) IMPLANT
GRASPER TIP-UP FEN DVNC XI (INSTRUMENTS) ×1 IMPLANT
IRRIGATOR SUCT 8 DISP DVNC XI (IRRIGATION / IRRIGATOR) IMPLANT
IV 0.9% NACL 1000 ML (IV SOLUTION) IMPLANT
KIT PINK PAD W/HEAD ARM REST (MISCELLANEOUS) ×1 IMPLANT
LABEL OR SOLS (LABEL) IMPLANT
MANIFOLD NEPTUNE II (INSTRUMENTS) ×1 IMPLANT
NDL DRIVE SUT CUT DVNC (INSTRUMENTS) ×1 IMPLANT
NDL HYPO 22X1.5 SAFETY MO (MISCELLANEOUS) ×1 IMPLANT
NDL INSUFFLATION 14GA 120MM (NEEDLE) ×1 IMPLANT
NEEDLE DRIVE SUT CUT DVNC (INSTRUMENTS) ×1 IMPLANT
NEEDLE HYPO 22X1.5 SAFETY MO (MISCELLANEOUS) ×1 IMPLANT
NEEDLE INSUFFLATION 14GA 120MM (NEEDLE) ×1 IMPLANT
OBTURATOR OPTICALSTD 8 DVNC (TROCAR) ×1 IMPLANT
PACK LAP CHOLECYSTECTOMY (MISCELLANEOUS) ×1 IMPLANT
RELOAD STAPLE 45 2.5 WHT DVNC (STAPLE) IMPLANT
RELOAD STAPLE 45 3.5 BLU DVNC (STAPLE) IMPLANT
SCISSORS MNPLR CVD DVNC XI (INSTRUMENTS) ×1 IMPLANT
SEAL UNIV 5-12 XI (MISCELLANEOUS) ×4 IMPLANT
SEALER VESSEL EXT DVNC XI (MISCELLANEOUS) IMPLANT
SET TUBE SMOKE EVAC HIGH FLOW (TUBING) ×1 IMPLANT
SOLUTION ELECTROSURG ANTI STCK (MISCELLANEOUS) ×1 IMPLANT
SPONGE T-LAP 4X18 ~~LOC~~+RFID (SPONGE) ×1 IMPLANT
STAPLER 45 SUREFORM DVNC (STAPLE) IMPLANT
SUT MNCRL AB 4-0 PS2 18 (SUTURE) ×1 IMPLANT
SUT STRATA 3-0 SH (SUTURE) IMPLANT
SUT VICRYL 0 UR6 27IN ABS (SUTURE) ×1 IMPLANT
SYR 30ML LL (SYRINGE) ×1 IMPLANT
SYSTEM BAG RETRIEVAL 10MM (BASKET) ×1 IMPLANT
TRAP FLUID SMOKE EVACUATOR (MISCELLANEOUS) ×1 IMPLANT
WATER STERILE IRR 500ML POUR (IV SOLUTION) ×1 IMPLANT

## 2024-05-13 NOTE — Anesthesia Preprocedure Evaluation (Addendum)
 Anesthesia Evaluation  Patient identified by MRN, date of birth, ID band Patient awake    Reviewed: Allergy & Precautions, NPO status , Patient's Chart, lab work & pertinent test results  History of Anesthesia Complications Negative for: history of anesthetic complications  Airway Mallampati: III  TM Distance: >3 FB Neck ROM: full    Dental  (+) Chipped, Caps   Pulmonary neg pulmonary ROS, neg shortness of breath   Pulmonary exam normal        Cardiovascular Exercise Tolerance: Good (-) angina Normal cardiovascular exam     Neuro/Psych  Neuromuscular disease  negative psych ROS   GI/Hepatic negative GI ROS, Neg liver ROS,neg GERD  ,,  Endo/Other  negative endocrine ROS    Renal/GU      Musculoskeletal   Abdominal   Peds  Hematology negative hematology ROS (+)   Anesthesia Other Findings Past Medical History: No date: Celiac disease No date: Medical history non-contributory No date: Thyroid  disease  Past Surgical History: 05/21/2015: CESAREAN SECTION; N/A     Comment:  Procedure: CESAREAN SECTION;  Surgeon: Burnard Bowers,               MD;  Location: [facility];  Service: Obstetrics;  Laterality:              N/A; 11/09/2023: COLONOSCOPY WITH PROPOFOL ; N/A     Comment:  Procedure: COLONOSCOPY WITH PROPOFOL ;  Surgeon: Onita Elspeth Sharper, DO;  Location: [facility] END[facility]COPY;  Service:               Gastroenterology;  Laterality: N/A; No date: LAPAROSCOPY  BMI    Body Mass Index: 29.76 kg/m      Reproductive/Obstetrics negative OB ROS                              Anesthesia Physical Anesthesia Plan  ASA: 2  Anesthesia Plan: General ETT   Post-op Pain Management:    Induction: Intravenous  PONV Risk Score and Plan: Ondansetron , Dexamethasone, Midazolam and Treatment may vary due to age or medical condition  Airway Management Planned: Oral ETT  Additional  Equipment:   Intra-op Plan:   Post-operative Plan: Extubation in OR  Informed Consent: I have reviewed the patients History and Physical, chart, labs and discussed the procedure including the risks, benefits and alternatives for the proposed anesthesia with the patient or authorized representative who has indicated his/her understanding and acceptance.     Dental Advisory Given  Plan Discussed with: Anesthesiologist, CRNA and Surgeon  Anesthesia Plan Comments: (Patient consented for risks of anesthesia including but not limited to:  - adverse reactions to medications - damage to eyes, teeth, lips or other oral mucosa - nerve damage due to positioning  - sore throat or hoarseness - Damage to heart, brain, nerves, lungs, other parts of body or loss of life  Patient voiced understanding and assent.)         Anesthesia Quick Evaluation

## 2024-05-13 NOTE — Anesthesia Postprocedure Evaluation (Signed)
 Anesthesia Post Note  Patient: Brittney [name]  Procedure(s) Performed: APPENDECTOMY, ROBOT-ASSISTED, LAPAROSCOPIC (Abdomen)  Patient location during evaluation: PACU Anesthesia Type: General Level of consciousness: awake and alert Pain management: pain level controlled Vital Signs Assessment: post-procedure vital signs reviewed and stable Respiratory status: spontaneous breathing, nonlabored ventilation and respiratory function stable Cardiovascular status: blood pressure returned to baseline and stable Postop Assessment: no apparent nausea or vomiting Anesthetic complications: no   No notable events documented.   Last Vitals:  Vitals:   05/13/24 1915 05/13/24 1930  BP:  126/77  Pulse: 81 72  Resp: 14 14  Temp:    SpO2: 97% 94%    Last Pain:  Vitals:   05/13/24 1930  TempSrc:   PainSc: Asleep                 Fairy POUR Watson Robarge

## 2024-05-13 NOTE — H&P (View-Only) (Signed)
 SURGICAL CONSULTATION NOTE   HISTORY OF PRESENT ILLNESS (HPI):  43 y.o. female presented to [facility] ED for evaluation of abdominal pain. Patient reports she started having abdominal pain last night.  Pain localized to the  periumbilical area and then radiated to the right lower quadrant.  Pain aggravated by abdominal wall movement.  Endorses nausea.  No vomiting.  Denies fever.  No alleviating factors.  She had a CT scan of the abdomen and pelvis today that shows acute appendicitis without perforation.  I personally evaluated the images.  Labs shows leukocytosis.  Surgery is consulted by Cyrus, PA in this context for evaluation and management of acute appendicitis.  PAST MEDICAL HISTORY (PMH):  Past Medical History:  Diagnosis Date   Celiac disease    Medical history non-contributory    Thyroid  disease      PAST SURGICAL HISTORY (PSH):  Past Surgical History:  Procedure Laterality Date   CESAREAN SECTION N/A 05/21/2015   Procedure: CESAREAN SECTION;  Surgeon: Burnard Bowers, MD;  Location: [facility];  Service: Obstetrics;  Laterality: N/A;   COLONOSCOPY WITH PROPOFOL  N/A 11/09/2023   Procedure: COLONOSCOPY WITH PROPOFOL ;  Surgeon: Onita Elspeth Sharper, DO;  Location: [facility] ENDOSCOPY;  Service: Gastroenterology;  Laterality: N/A;   LAPAROSCOPY       MEDICATIONS:  Prior to Admission medications   Medication Sig Start Date End Date Taking? Authorizing Provider  escitalopram (LEXAPRO) 5 MG tablet Take 5 mg by mouth daily. 06/26/20   [provider]  fluticasone (FLONASE) 50 MCG/ACT nasal spray Place into the nose.    [provider]  hydrocortisone  2.5 % cream Apply twice daily to lips as needed up to 2 weeks 08/01/21   Moye, [place] , MD  ketorolac  (TORADOL ) 10 MG tablet Take 1 tablet (10 mg total) by mouth every 8 (eight) hours as needed for moderate pain (with food). Patient not taking: Reported on 07/26/2020 11/26/17   Pasco Alexandria, MD  levothyroxine  (SYNTHROID, LEVOTHROID) 75 MCG tablet  10/21/17   [provider]  Lidocaine  5 % CREA Apply 1 Application topically as directed. Tid to aa rash prn itch 11/12/23   Hester Alm BROCKS, MD  loperamide  (IMODIUM ) 2 MG capsule Take by mouth.    [provider]  loratadine (CLARITIN) 5 MG/5ML syrup Take by mouth daily.    [provider]  Multiple Vitamin (MULTI-VITAMINS) TABS Take by mouth.    [provider]  norethindrone-ethinyl estradiol (MICROGESTIN,JUNEL,LOESTRIN) 1-20 MG-MCG tablet Take by mouth.    [provider]  ondansetron  (ZOFRAN  ODT) 4 MG disintegrating tablet Take 1 tablet (4 mg total) by mouth every 8 (eight) hours as needed for nausea or vomiting. Patient not taking: Reported on 07/26/2020 11/26/17   Pasco Alexandria, MD  valACYclovir (VALTREX) 1000 MG tablet Take 1,000 mg by mouth 3 (three) times daily. Patient not taking: Reported on 11/09/2023 10/22/23   [provider]     ALLERGIES:  Allergies  Allergen Reactions   Gluten Meal     celiac disease   Ceftin [Cefuroxime Axetil] Rash     SOCIAL HISTORY:  Social History   Socioeconomic History   Marital status: Married    Spouse name: Not on file   Number of children: Not on file   Years of education: Not on file   Highest education level: Not on file  Occupational History   Not on file  Tobacco Use   Smoking status: Never   Smokeless tobacco: Never  Vaping Use   Vaping  status: Never Used  Substance and Sexual Activity   Alcohol use: No   Drug use: No   Sexual activity: Yes    Birth control/protection: None  Other Topics Concern   Not on file  Social History Narrative   Not on file   Social Drivers of Health   Financial Resource Strain: Low Risk  (09/07/2023)   Received from [facility]   Overall Financial Resource Strain (CARDIA)    Difficulty of Paying Living Expenses: Not hard at all  Food Insecurity: No Food Insecurity (09/07/2023)    Received from [facility]   Hunger Vital Sign    Within the past 12 months, you worried that your food would run out before you got the money to buy more.: Never true    Within the past 12 months, the food you bought just didn't last and you didn't have money to get more.: Never true  Transportation Needs: No Transportation Needs (09/07/2023)   Received from [facility] - Transportation    In the past 12 months, has lack of transportation kept you from medical appointments or from getting medications?: No    Lack of Transportation (Non-Medical): No  Physical Activity: Not on file  Stress: Not on file  Social Connections: Not on file  Intimate Partner Violence: Not on file      FAMILY HISTORY:  Family History  Problem Relation Age of Onset   Cancer Mother    Cancer Maternal Grandmother    Bladder Cancer Neg Hx    Kidney cancer Neg Hx      REVIEW OF SYSTEMS:  Constitutional: denies weight loss, fever, chills, or sweats  Eyes: denies any other vision changes, history of eye injury  ENT: denies sore throat, hearing problems  Respiratory: denies shortness of breath, wheezing  Cardiovascular: denies chest pain, palpitations  Gastrointestinal: positive abdominal pain, nausea and vomiting Genitourinary: denies burning with urination or urinary frequency Musculoskeletal: denies any other joint pains or cramps  Skin: denies any other rashes or skin discolorations  Neurological: denies any other headache, dizziness, weakness  Psychiatric: denies any other depression, anxiety   All other review of systems were negative   VITAL SIGNS:  Vitals:   05/13/24 1357  BP: 132/86  Pulse: 80  Resp: 18  Temp: 36.4 C (97.5 F)    PHYSICAL EXAM:  Constitutional:  -- Normal body habitus  -- Awake, alert, and oriented x3  Eyes:  -- Pupils equally round and reactive to light  -- No scleral icterus  Ear, nose, and throat:  -- No jugular venous  distension  Pulmonary:  -- No crackles  -- Equal breath sounds bilaterally -- Breathing non-labored at rest Cardiovascular:  -- S1, S2 present  -- No pericardial rubs Gastrointestinal:  -- Abdomen soft, nontender, non-distended, no guarding or rebound tenderness -- No abdominal masses appreciated, pulsatile or otherwise  Musculoskeletal and Integumentary:  -- Wounds or skin discoloration: None appreciated -- Extremities: B/L UE and LE FROM, hands and feet warm, no edema  Neurologic:  -- Motor function: intact and symmetric -- Sensation: intact and symmetric   Labs:     Latest Ref Rng & Units 11/26/2017    3:48 PM 09/08/2016   10:19 AM 05/22/2015    5:15 AM  CBC  WBC 3.6 - 11.0 K/uL 8.0  6.2  11.6   Hemoglobin 12.0 - 16.0 g/dL 86.9  86.7  9.4   Hematocrit 35.0 - 47.0 %  37.9  38.8  27.7   Platelets 150 - 440 K/uL 271  198  230       Latest Ref Rng & Units 05/13/2024   10:15 AM 11/26/2017    3:48 PM 09/08/2016   10:19 AM  CMP  Glucose 65 - 99 mg/dL  829  890   BUN 6 - 20 mg/dL  13  15   Creatinine 9.55 - 1.00 mg/dL 9.29  9.35  9.28   Sodium 135 - 145 mmol/L  136  138   Potassium 3.5 - 5.1 mmol/L  4.1  3.7   Chloride 101 - 111 mmol/L  105  106   CO2 22 - 32 mmol/L  24  26   Calcium 8.9 - 10.3 mg/dL  8.9  8.7   Total Protein 6.5 - 8.1 g/dL  7.4  7.4   Total Bilirubin 0.3 - 1.2 mg/dL  0.3  0.6   Alkaline Phos 38 - 126 U/L  48  64   AST 15 - 41 U/L  29  40   ALT 14 - 54 U/L  17  23      Imaging studies:  EXAM: CT ABDOMEN AND PELVIS WITH CONTRAST 05/13/2024 10:23:13 AM   TECHNIQUE: CT of the abdomen and pelvis was performed with the administration of intravenous contrast. 100 mL of iohexol (OMNIPAQUE) 300 MG/ML solution was administered. Multiplanar reformatted images are provided for review. Automated exposure control, iterative reconstruction, and/or weight-based adjustment of the mA/kV was utilized to reduce the radiation dose to as low as reasonably achievable.    COMPARISON: 11/26/2017   CLINICAL HISTORY: Severe lower abdominal pain with nausea since last night.   FINDINGS:   LOWER CHEST: No acute abnormality.   LIVER: 1 cm cyst in the right hepatic lobe. No intrahepatic biliary ductal dilation and  the portal veins are patent. GALLBLADDER AND BILE DUCTS: Gallbladder is unremarkable. No biliary ductal dilatation.   SPLEEN: A couple of subcentimeter hypodensities noted in the spleen, likely small cysts.   PANCREAS: No acute abnormality.   ADRENAL GLANDS: No acute abnormality.   KIDNEYS, URETERS AND BLADDER: No stones in the kidneys or ureters. No hydronephrosis. No perinephric or periureteral stranding. Urinary bladder is unremarkable.   GI AND BOWEL: Stomach demonstrates no acute abnormality. The retrocecal appendix is dilated with wall thickening and moderate periappendiceal inflammation. There is no bowel obstruction.   PERITONEUM AND RETROPERITONEUM: No pneumoperitoneum.   VASCULATURE: Aorta is normal in caliber.   LYMPH NODES: No lymphadenopathy.   REPRODUCTIVE ORGANS: The uterus and ovaries are within normal limits for the patient's age. Small volume free fluid in the pelvis, which may be physiologic in a female of this age or reactive.   BONES AND SOFT TISSUES: No acute osseous abnormality. No focal soft tissue abnormality.   IMPRESSION: 1. Acute uncomplicated appendicitis. 2. Small volume free fluid in the pelvis, which may be physiologic in a female of this age or reactive.   Electronically signed by: Rogelia Myers MD 05/13/2024 10:59 AM EST RP  Assessment/Plan:  43 y.o. female with acute appendicitis, complicated by pertinent comorbidities including hypothyroidism.  Patient with history, physical exam and images consistent with acute appendicitis. Patient oriented about diagnosis and surgical management as treatment. Patient oriented about goals of surgery and its risk including: bowel injury,  infection, abscess, bleeding, leak from cecum, intestinal adhesions, bowel obstruction, fistula, injury to the ureter among others.  Patient understood and agreed to proceed with surgery. Will admit patient, already started  on antibiotic therapy, will give IV hydration since patient is NPO and schedule to OR.   Lucas Petrin, MD

## 2024-05-13 NOTE — Interval H&P Note (Signed)
 History and Physical Interval Note:  05/13/2024 5:03 PM  Brittney [name]  has presented today for surgery, with the diagnosis of Appendicitis.  The various methods of treatment have been discussed with the patient and family. After consideration of risks, benefits and other options for treatment, the patient has consented to  Procedure(s): APPENDECTOMY, ROBOT-ASSISTED, LAPAROSCOPIC (N/A) as a surgical intervention.  The patient's history has been reviewed, patient examined, no change in status, stable for surgery.  I have reviewed the patient's chart and labs.  Questions were answered to the patient's satisfaction.     [name]

## 2024-05-13 NOTE — Discharge Instructions (Signed)

## 2024-05-13 NOTE — Anesthesia Procedure Notes (Signed)
 Procedure Name: Intubation Date/Time: 05/13/2024 5:09 PM  Performed by: Delores Evalene BROCKS, CRNAPre-anesthesia Checklist: Patient identified, Emergency Drugs available, Suction available and Patient being monitored Patient Re-evaluated:Patient Re-evaluated prior to induction Oxygen Delivery Method: Circle system utilized Preoxygenation: Pre-oxygenation with 100% oxygen Induction Type: IV induction Ventilation: Mask ventilation without difficulty Laryngoscope Size: McGrath and 3 Grade View: Grade I Tube type: Oral Number of attempts: 1 Airway Equipment and Method: Stylet, Oral airway and Video-laryngoscopy Placement Confirmation: ETT inserted through vocal cords under direct vision, positive ETCO2 and breath sounds checked- equal and bilateral Secured at: 21 cm Tube secured with: Tape Dental Injury: Teeth and Oropharynx as per pre-operative assessment

## 2024-05-13 NOTE — Transfer of Care (Signed)
 Immediate Anesthesia Transfer of Care Note  Patient: Brittney [name]  Procedure(s) Performed: APPENDECTOMY, ROBOT-ASSISTED, LAPAROSCOPIC (Abdomen)  Patient Location: PACU  Anesthesia Type:General  Level of Consciousness: drowsy  Airway & Oxygen Therapy: Patient Spontanous Breathing and Patient connected to face mask oxygen  Post-op Assessment: Report given to RN and Post -op Vital signs reviewed and stable  Post vital signs: Reviewed and stable  Last Vitals:  Vitals Value Taken Time  BP 97/50 05/13/24 18:39  Temp    Pulse 70 05/13/24 18:42  Resp 11 05/13/24 18:42  SpO2 96 % 05/13/24 18:42  Vitals shown include unfiled device data.  Last Pain:  Vitals:   05/13/24 1449  TempSrc: Temporal  PainSc: 9       Patients Stated Pain Goal: 3 (05/13/24 1449)  Complications: No notable events documented.

## 2024-05-13 NOTE — Op Note (Signed)
 Pre-op Diagnosis: Acute appendicitis   Post op Diagnosis: Acute appenditicis  Procedure: Robotic assisted laparoscopic appendectomy.  Anesthesia: GETA  Surgeon: [name], MD, FACS  Wound Classification: clean contaminated  Specimen: Appendix  Complications: None  Estimated Blood Loss: 3 mL   Indications: Patient is a 43 y.o. female  presented with above right lower quadrant pain. CT scan shows acute appendicitis.     FIndings: 1.  Suppurative appendix  2. No peri-appendiceal abscess or phlegmon 3. Normal anatomy 4. Adequate hemostasis.            Description of procedure: The patient was placed on the operating table in the supine position. General anesthesia was induced. A time-out was completed verifying correct patient, procedure, site, positioning, and implant(s) and/or special equipment prior to beginning this procedure. The abdomen was prepped and draped in the usual sterile fashion.   Palmer's point located and Veress needle was inserted.  After confirming 2 clicks and a positive saline drop test, gas insufflation was initiated until the abdominal pressure was measured at 15 mmHg.  Afterwards, the Veress needle was removed and a 8 mm port was placed in left upper quadrant area using Optiview technique.  After local was infused, 3 additional incision on the left abdominal wall were made 5 cm apart.  An 12 mm port and two other 8 mm ports were placed under direct visualization.  No injuries from trocar placements were noted.  The table was placed in the Trendelenburg position with the right side elevated.  With the use of Tip up grasper, fenestrated bipolar and monopolar scissors, an inflamed appendix was identified and elevated.  Window created at base of appendix in the mesentery.    The mesoappendix was divided with combination of bipolar energy and monopolar scissors.  The base of the appendix was ligated with 3-0 Stratafix.  The appendix was divided with  monopolar scissors.  A second layer of the 3-0 Stratafix was done over the appendiceal stump.  The appendiceal stump was examined and hemostasis noted. No other pathology was identified within pelvis. The 12 mm trocar removed and port site closed with PMI using 0 vicryl under direct vision. Remaining trocars were removed under direct vision. No bleeding was noted.The abdomen was allowed to collapse.  All skin incisions then closed with subcuticular sutures Monocryl 4-0.  Wounds then dressed with dermabond.  The patient tolerated the procedure well, awakened from anesthesia and was taken to the postanesthesia care unit in satisfactory condition.  Sponge count and instrument count correct at the end of the procedure.

## 2024-05-13 NOTE — H&P (Signed)
 SURGICAL CONSULTATION NOTE   HISTORY OF PRESENT ILLNESS (HPI):  43 y.o. female presented to [facility] ED for evaluation of abdominal pain. Patient reports she started having abdominal pain last night.  Pain localized to the  periumbilical area and then radiated to the right lower quadrant.  Pain aggravated by abdominal wall movement.  Endorses nausea.  No vomiting.  Denies fever.  No alleviating factors.  She had a CT scan of the abdomen and pelvis today that shows acute appendicitis without perforation.  I personally evaluated the images.  Labs shows leukocytosis.  Surgery is consulted by Cyrus, PA in this context for evaluation and management of acute appendicitis.  PAST MEDICAL HISTORY (PMH):  Past Medical History:  Diagnosis Date   Celiac disease    Medical history non-contributory    Thyroid  disease      PAST SURGICAL HISTORY (PSH):  Past Surgical History:  Procedure Laterality Date   CESAREAN SECTION N/A 05/21/2015   Procedure: CESAREAN SECTION;  Surgeon: Burnard Bowers, MD;  Location: [facility];  Service: Obstetrics;  Laterality: N/A;   COLONOSCOPY WITH PROPOFOL  N/A 11/09/2023   Procedure: COLONOSCOPY WITH PROPOFOL ;  Surgeon: Onita Elspeth Sharper, DO;  Location: [facility] ENDOSCOPY;  Service: Gastroenterology;  Laterality: N/A;   LAPAROSCOPY       MEDICATIONS:  Prior to Admission medications   Medication Sig Start Date End Date Taking? Authorizing Provider  escitalopram (LEXAPRO) 5 MG tablet Take 5 mg by mouth daily. 06/26/20   [provider]  fluticasone (FLONASE) 50 MCG/ACT nasal spray Place into the nose.    [provider]  hydrocortisone  2.5 % cream Apply twice daily to lips as needed up to 2 weeks 08/01/21   Moye, [place] , MD  ketorolac  (TORADOL ) 10 MG tablet Take 1 tablet (10 mg total) by mouth every 8 (eight) hours as needed for moderate pain (with food). Patient not taking: Reported on 07/26/2020 11/26/17   Pasco Alexandria, MD  levothyroxine  (SYNTHROID, LEVOTHROID) 75 MCG tablet  10/21/17   [provider]  Lidocaine  5 % CREA Apply 1 Application topically as directed. Tid to aa rash prn itch 11/12/23   Hester Alm BROCKS, MD  loperamide  (IMODIUM ) 2 MG capsule Take by mouth.    [provider]  loratadine (CLARITIN) 5 MG/5ML syrup Take by mouth daily.    [provider]  Multiple Vitamin (MULTI-VITAMINS) TABS Take by mouth.    [provider]  norethindrone-ethinyl estradiol (MICROGESTIN,JUNEL,LOESTRIN) 1-20 MG-MCG tablet Take by mouth.    [provider]  ondansetron  (ZOFRAN  ODT) 4 MG disintegrating tablet Take 1 tablet (4 mg total) by mouth every 8 (eight) hours as needed for nausea or vomiting. Patient not taking: Reported on 07/26/2020 11/26/17   Pasco Alexandria, MD  valACYclovir (VALTREX) 1000 MG tablet Take 1,000 mg by mouth 3 (three) times daily. Patient not taking: Reported on 11/09/2023 10/22/23   [provider]     ALLERGIES:  Allergies  Allergen Reactions   Gluten Meal     celiac disease   Ceftin [Cefuroxime Axetil] Rash     SOCIAL HISTORY:  Social History   Socioeconomic History   Marital status: Married    Spouse name: Not on file   Number of children: Not on file   Years of education: Not on file   Highest education level: Not on file  Occupational History   Not on file  Tobacco Use   Smoking status: Never   Smokeless tobacco: Never  Vaping Use   Vaping  status: Never Used  Substance and Sexual Activity   Alcohol use: No   Drug use: No   Sexual activity: Yes    Birth control/protection: None  Other Topics Concern   Not on file  Social History Narrative   Not on file   Social Drivers of Health   Financial Resource Strain: Low Risk  (09/07/2023)   Received from [facility]   Overall Financial Resource Strain (CARDIA)    Difficulty of Paying Living Expenses: Not hard at all  Food Insecurity: No Food Insecurity (09/07/2023)    Received from [facility]   Hunger Vital Sign    Within the past 12 months, you worried that your food would run out before you got the money to buy more.: Never true    Within the past 12 months, the food you bought just didn't last and you didn't have money to get more.: Never true  Transportation Needs: No Transportation Needs (09/07/2023)   Received from [facility] - Transportation    In the past 12 months, has lack of transportation kept you from medical appointments or from getting medications?: No    Lack of Transportation (Non-Medical): No  Physical Activity: Not on file  Stress: Not on file  Social Connections: Not on file  Intimate Partner Violence: Not on file      FAMILY HISTORY:  Family History  Problem Relation Age of Onset   Cancer Mother    Cancer Maternal Grandmother    Bladder Cancer Neg Hx    Kidney cancer Neg Hx      REVIEW OF SYSTEMS:  Constitutional: denies weight loss, fever, chills, or sweats  Eyes: denies any other vision changes, history of eye injury  ENT: denies sore throat, hearing problems  Respiratory: denies shortness of breath, wheezing  Cardiovascular: denies chest pain, palpitations  Gastrointestinal: positive abdominal pain, nausea and vomiting Genitourinary: denies burning with urination or urinary frequency Musculoskeletal: denies any other joint pains or cramps  Skin: denies any other rashes or skin discolorations  Neurological: denies any other headache, dizziness, weakness  Psychiatric: denies any other depression, anxiety   All other review of systems were negative   VITAL SIGNS:  Vitals:   05/13/24 1357  BP: 132/86  Pulse: 80  Resp: 18  Temp: 36.4 C (97.5 F)    PHYSICAL EXAM:  Constitutional:  -- Normal body habitus  -- Awake, alert, and oriented x3  Eyes:  -- Pupils equally round and reactive to light  -- No scleral icterus  Ear, nose, and throat:  -- No jugular venous  distension  Pulmonary:  -- No crackles  -- Equal breath sounds bilaterally -- Breathing non-labored at rest Cardiovascular:  -- S1, S2 present  -- No pericardial rubs Gastrointestinal:  -- Abdomen soft, nontender, non-distended, no guarding or rebound tenderness -- No abdominal masses appreciated, pulsatile or otherwise  Musculoskeletal and Integumentary:  -- Wounds or skin discoloration: None appreciated -- Extremities: B/L UE and LE FROM, hands and feet warm, no edema  Neurologic:  -- Motor function: intact and symmetric -- Sensation: intact and symmetric   Labs:     Latest Ref Rng & Units 11/26/2017    3:48 PM 09/08/2016   10:19 AM 05/22/2015    5:15 AM  CBC  WBC 3.6 - 11.0 K/uL 8.0  6.2  11.6   Hemoglobin 12.0 - 16.0 g/dL 86.9  86.7  9.4   Hematocrit 35.0 - 47.0 %  37.9  38.8  27.7   Platelets 150 - 440 K/uL 271  198  230       Latest Ref Rng & Units 05/13/2024   10:15 AM 11/26/2017    3:48 PM 09/08/2016   10:19 AM  CMP  Glucose 65 - 99 mg/dL  829  890   BUN 6 - 20 mg/dL  13  15   Creatinine 9.55 - 1.00 mg/dL 9.29  9.35  9.28   Sodium 135 - 145 mmol/L  136  138   Potassium 3.5 - 5.1 mmol/L  4.1  3.7   Chloride 101 - 111 mmol/L  105  106   CO2 22 - 32 mmol/L  24  26   Calcium 8.9 - 10.3 mg/dL  8.9  8.7   Total Protein 6.5 - 8.1 g/dL  7.4  7.4   Total Bilirubin 0.3 - 1.2 mg/dL  0.3  0.6   Alkaline Phos 38 - 126 U/L  48  64   AST 15 - 41 U/L  29  40   ALT 14 - 54 U/L  17  23      Imaging studies:  EXAM: CT ABDOMEN AND PELVIS WITH CONTRAST 05/13/2024 10:23:13 AM   TECHNIQUE: CT of the abdomen and pelvis was performed with the administration of intravenous contrast. 100 mL of iohexol (OMNIPAQUE) 300 MG/ML solution was administered. Multiplanar reformatted images are provided for review. Automated exposure control, iterative reconstruction, and/or weight-based adjustment of the mA/kV was utilized to reduce the radiation dose to as low as reasonably achievable.    COMPARISON: 11/26/2017   CLINICAL HISTORY: Severe lower abdominal pain with nausea since last night.   FINDINGS:   LOWER CHEST: No acute abnormality.   LIVER: 1 cm cyst in the right hepatic lobe. No intrahepatic biliary ductal dilation and  the portal veins are patent. GALLBLADDER AND BILE DUCTS: Gallbladder is unremarkable. No biliary ductal dilatation.   SPLEEN: A couple of subcentimeter hypodensities noted in the spleen, likely small cysts.   PANCREAS: No acute abnormality.   ADRENAL GLANDS: No acute abnormality.   KIDNEYS, URETERS AND BLADDER: No stones in the kidneys or ureters. No hydronephrosis. No perinephric or periureteral stranding. Urinary bladder is unremarkable.   GI AND BOWEL: Stomach demonstrates no acute abnormality. The retrocecal appendix is dilated with wall thickening and moderate periappendiceal inflammation. There is no bowel obstruction.   PERITONEUM AND RETROPERITONEUM: No pneumoperitoneum.   VASCULATURE: Aorta is normal in caliber.   LYMPH NODES: No lymphadenopathy.   REPRODUCTIVE ORGANS: The uterus and ovaries are within normal limits for the patient's age. Small volume free fluid in the pelvis, which may be physiologic in a female of this age or reactive.   BONES AND SOFT TISSUES: No acute osseous abnormality. No focal soft tissue abnormality.   IMPRESSION: 1. Acute uncomplicated appendicitis. 2. Small volume free fluid in the pelvis, which may be physiologic in a female of this age or reactive.   Electronically signed by: Rogelia Myers MD 05/13/2024 10:59 AM EST RP  Assessment/Plan:  43 y.o. female with acute appendicitis, complicated by pertinent comorbidities including hypothyroidism.  Patient with history, physical exam and images consistent with acute appendicitis. Patient oriented about diagnosis and surgical management as treatment. Patient oriented about goals of surgery and its risk including: bowel injury,  infection, abscess, bleeding, leak from cecum, intestinal adhesions, bowel obstruction, fistula, injury to the ureter among others.  Patient understood and agreed to proceed with surgery. Will admit patient, already started  on antibiotic therapy, will give IV hydration since patient is NPO and schedule to OR.   Lucas Petrin, MD

## 2024-05-14 ENCOUNTER — Encounter: Payer: Self-pay | Admitting: General Surgery

## 2024-05-17 LAB — SURGICAL PATHOLOGY

## 2024-11-15 ENCOUNTER — Encounter: Admitting: Dermatology
